# Patient Record
Sex: Male | Born: 1945 | Race: White | Hispanic: No | Marital: Married | Smoking: Never smoker
Health system: Southern US, Community
[De-identification: ages and names within clinical notes are randomized; demographics above are authoritative.]

## PROBLEM LIST (undated history)

## (undated) DIAGNOSIS — F419 Anxiety disorder, unspecified: Secondary | ICD-10-CM

## (undated) DIAGNOSIS — E7800 Pure hypercholesterolemia, unspecified: Secondary | ICD-10-CM

## (undated) DIAGNOSIS — F32A Depression, unspecified: Secondary | ICD-10-CM

## (undated) DIAGNOSIS — I10 Essential (primary) hypertension: Secondary | ICD-10-CM

## (undated) DIAGNOSIS — J45909 Unspecified asthma, uncomplicated: Secondary | ICD-10-CM

## (undated) DIAGNOSIS — J189 Pneumonia, unspecified organism: Secondary | ICD-10-CM

## (undated) DIAGNOSIS — E119 Type 2 diabetes mellitus without complications: Secondary | ICD-10-CM

## (undated) DIAGNOSIS — N289 Disorder of kidney and ureter, unspecified: Secondary | ICD-10-CM

## (undated) HISTORY — PX: NASAL RECONSTRUCTION: SHX2069

## (undated) HISTORY — PX: VASECTOMY: SHX75

## (undated) HISTORY — PX: TONSILLECTOMY: SUR1361

## (undated) HISTORY — PX: VASECTOMY REVERSAL: SHX243

---

## 2015-11-05 DIAGNOSIS — M25511 Pain in right shoulder: Secondary | ICD-10-CM | POA: Diagnosis not present

## 2015-11-19 DIAGNOSIS — K219 Gastro-esophageal reflux disease without esophagitis: Secondary | ICD-10-CM | POA: Diagnosis not present

## 2015-11-19 DIAGNOSIS — Z6827 Body mass index (BMI) 27.0-27.9, adult: Secondary | ICD-10-CM | POA: Diagnosis not present

## 2015-11-19 DIAGNOSIS — L408 Other psoriasis: Secondary | ICD-10-CM | POA: Diagnosis not present

## 2015-11-19 DIAGNOSIS — Z87891 Personal history of nicotine dependence: Secondary | ICD-10-CM | POA: Diagnosis not present

## 2015-11-19 DIAGNOSIS — I10 Essential (primary) hypertension: Secondary | ICD-10-CM | POA: Diagnosis not present

## 2015-12-10 DIAGNOSIS — E7800 Pure hypercholesterolemia, unspecified: Secondary | ICD-10-CM | POA: Diagnosis not present

## 2015-12-10 DIAGNOSIS — I10 Essential (primary) hypertension: Secondary | ICD-10-CM | POA: Diagnosis not present

## 2016-01-21 DIAGNOSIS — I10 Essential (primary) hypertension: Secondary | ICD-10-CM | POA: Diagnosis not present

## 2016-01-21 DIAGNOSIS — E7800 Pure hypercholesterolemia, unspecified: Secondary | ICD-10-CM | POA: Diagnosis not present

## 2016-02-02 DIAGNOSIS — E7800 Pure hypercholesterolemia, unspecified: Secondary | ICD-10-CM | POA: Diagnosis not present

## 2016-02-02 DIAGNOSIS — Z823 Family history of stroke: Secondary | ICD-10-CM | POA: Diagnosis not present

## 2016-02-02 DIAGNOSIS — F329 Major depressive disorder, single episode, unspecified: Secondary | ICD-10-CM | POA: Diagnosis not present

## 2016-02-02 DIAGNOSIS — R410 Disorientation, unspecified: Secondary | ICD-10-CM | POA: Diagnosis not present

## 2016-02-02 DIAGNOSIS — Z79899 Other long term (current) drug therapy: Secondary | ICD-10-CM | POA: Diagnosis not present

## 2016-02-02 DIAGNOSIS — I10 Essential (primary) hypertension: Secondary | ICD-10-CM | POA: Diagnosis not present

## 2016-02-02 DIAGNOSIS — Z7982 Long term (current) use of aspirin: Secondary | ICD-10-CM | POA: Diagnosis not present

## 2016-02-02 DIAGNOSIS — E119 Type 2 diabetes mellitus without complications: Secondary | ICD-10-CM | POA: Diagnosis not present

## 2016-02-02 DIAGNOSIS — R4182 Altered mental status, unspecified: Secondary | ICD-10-CM | POA: Diagnosis not present

## 2016-02-04 DIAGNOSIS — Z299 Encounter for prophylactic measures, unspecified: Secondary | ICD-10-CM | POA: Diagnosis not present

## 2016-02-04 DIAGNOSIS — Z1389 Encounter for screening for other disorder: Secondary | ICD-10-CM | POA: Diagnosis not present

## 2016-02-04 DIAGNOSIS — E782 Mixed hyperlipidemia: Secondary | ICD-10-CM | POA: Diagnosis not present

## 2016-02-04 DIAGNOSIS — G459 Transient cerebral ischemic attack, unspecified: Secondary | ICD-10-CM | POA: Diagnosis not present

## 2016-02-04 DIAGNOSIS — F329 Major depressive disorder, single episode, unspecified: Secondary | ICD-10-CM | POA: Diagnosis not present

## 2016-02-11 DIAGNOSIS — I6523 Occlusion and stenosis of bilateral carotid arteries: Secondary | ICD-10-CM | POA: Diagnosis not present

## 2016-02-11 DIAGNOSIS — G459 Transient cerebral ischemic attack, unspecified: Secondary | ICD-10-CM | POA: Diagnosis not present

## 2016-03-07 DIAGNOSIS — I10 Essential (primary) hypertension: Secondary | ICD-10-CM | POA: Diagnosis not present

## 2016-03-07 DIAGNOSIS — E7800 Pure hypercholesterolemia, unspecified: Secondary | ICD-10-CM | POA: Diagnosis not present

## 2016-04-14 DIAGNOSIS — H2513 Age-related nuclear cataract, bilateral: Secondary | ICD-10-CM | POA: Diagnosis not present

## 2016-05-20 DIAGNOSIS — Z0000 Encounter for general adult medical examination without abnormal findings: Secondary | ICD-10-CM | POA: Diagnosis not present

## 2016-05-20 DIAGNOSIS — Z79899 Other long term (current) drug therapy: Secondary | ICD-10-CM | POA: Diagnosis not present

## 2016-05-20 DIAGNOSIS — Z1211 Encounter for screening for malignant neoplasm of colon: Secondary | ICD-10-CM | POA: Diagnosis not present

## 2016-05-20 DIAGNOSIS — Z299 Encounter for prophylactic measures, unspecified: Secondary | ICD-10-CM | POA: Diagnosis not present

## 2016-05-20 DIAGNOSIS — Z1389 Encounter for screening for other disorder: Secondary | ICD-10-CM | POA: Diagnosis not present

## 2016-05-20 DIAGNOSIS — Z7189 Other specified counseling: Secondary | ICD-10-CM | POA: Diagnosis not present

## 2016-05-20 DIAGNOSIS — R5383 Other fatigue: Secondary | ICD-10-CM | POA: Diagnosis not present

## 2016-05-20 DIAGNOSIS — E782 Mixed hyperlipidemia: Secondary | ICD-10-CM | POA: Diagnosis not present

## 2016-05-20 DIAGNOSIS — Z125 Encounter for screening for malignant neoplasm of prostate: Secondary | ICD-10-CM | POA: Diagnosis not present

## 2016-06-09 DIAGNOSIS — Z23 Encounter for immunization: Secondary | ICD-10-CM | POA: Diagnosis not present

## 2016-06-10 DIAGNOSIS — E1122 Type 2 diabetes mellitus with diabetic chronic kidney disease: Secondary | ICD-10-CM | POA: Diagnosis not present

## 2016-06-10 DIAGNOSIS — N182 Chronic kidney disease, stage 2 (mild): Secondary | ICD-10-CM | POA: Diagnosis not present

## 2016-06-10 DIAGNOSIS — I10 Essential (primary) hypertension: Secondary | ICD-10-CM | POA: Diagnosis not present

## 2016-06-10 DIAGNOSIS — F329 Major depressive disorder, single episode, unspecified: Secondary | ICD-10-CM | POA: Diagnosis not present

## 2016-06-10 DIAGNOSIS — Z6827 Body mass index (BMI) 27.0-27.9, adult: Secondary | ICD-10-CM | POA: Diagnosis not present

## 2016-06-24 DIAGNOSIS — E119 Type 2 diabetes mellitus without complications: Secondary | ICD-10-CM | POA: Diagnosis not present

## 2016-07-18 DIAGNOSIS — I129 Hypertensive chronic kidney disease with stage 1 through stage 4 chronic kidney disease, or unspecified chronic kidney disease: Secondary | ICD-10-CM | POA: Diagnosis not present

## 2016-07-18 DIAGNOSIS — L049 Acute lymphadenitis, unspecified: Secondary | ICD-10-CM | POA: Diagnosis not present

## 2016-07-18 DIAGNOSIS — E1129 Type 2 diabetes mellitus with other diabetic kidney complication: Secondary | ICD-10-CM | POA: Diagnosis not present

## 2016-07-18 DIAGNOSIS — N182 Chronic kidney disease, stage 2 (mild): Secondary | ICD-10-CM | POA: Diagnosis not present

## 2016-07-18 DIAGNOSIS — E7800 Pure hypercholesterolemia, unspecified: Secondary | ICD-10-CM | POA: Diagnosis not present

## 2016-07-21 DIAGNOSIS — H25012 Cortical age-related cataract, left eye: Secondary | ICD-10-CM | POA: Diagnosis not present

## 2016-07-21 DIAGNOSIS — H2512 Age-related nuclear cataract, left eye: Secondary | ICD-10-CM | POA: Diagnosis not present

## 2016-07-21 DIAGNOSIS — H25013 Cortical age-related cataract, bilateral: Secondary | ICD-10-CM | POA: Diagnosis not present

## 2016-07-21 DIAGNOSIS — H35033 Hypertensive retinopathy, bilateral: Secondary | ICD-10-CM | POA: Diagnosis not present

## 2016-07-21 DIAGNOSIS — H2513 Age-related nuclear cataract, bilateral: Secondary | ICD-10-CM | POA: Diagnosis not present

## 2016-07-29 DIAGNOSIS — H2511 Age-related nuclear cataract, right eye: Secondary | ICD-10-CM | POA: Diagnosis not present

## 2016-08-19 DIAGNOSIS — H2512 Age-related nuclear cataract, left eye: Secondary | ICD-10-CM | POA: Diagnosis not present

## 2016-08-19 DIAGNOSIS — H25012 Cortical age-related cataract, left eye: Secondary | ICD-10-CM | POA: Diagnosis not present

## 2016-08-26 DIAGNOSIS — H25012 Cortical age-related cataract, left eye: Secondary | ICD-10-CM | POA: Diagnosis not present

## 2016-08-26 DIAGNOSIS — H2512 Age-related nuclear cataract, left eye: Secondary | ICD-10-CM | POA: Diagnosis not present

## 2016-08-26 DIAGNOSIS — H25812 Combined forms of age-related cataract, left eye: Secondary | ICD-10-CM | POA: Diagnosis not present

## 2016-09-15 DIAGNOSIS — Z713 Dietary counseling and surveillance: Secondary | ICD-10-CM | POA: Diagnosis not present

## 2016-09-15 DIAGNOSIS — Z6826 Body mass index (BMI) 26.0-26.9, adult: Secondary | ICD-10-CM | POA: Diagnosis not present

## 2016-09-15 DIAGNOSIS — N182 Chronic kidney disease, stage 2 (mild): Secondary | ICD-10-CM | POA: Diagnosis not present

## 2016-09-15 DIAGNOSIS — Z299 Encounter for prophylactic measures, unspecified: Secondary | ICD-10-CM | POA: Diagnosis not present

## 2016-09-15 DIAGNOSIS — E1122 Type 2 diabetes mellitus with diabetic chronic kidney disease: Secondary | ICD-10-CM | POA: Diagnosis not present

## 2016-11-21 DIAGNOSIS — E1122 Type 2 diabetes mellitus with diabetic chronic kidney disease: Secondary | ICD-10-CM | POA: Diagnosis not present

## 2016-11-21 DIAGNOSIS — Z6826 Body mass index (BMI) 26.0-26.9, adult: Secondary | ICD-10-CM | POA: Diagnosis not present

## 2016-11-21 DIAGNOSIS — Z299 Encounter for prophylactic measures, unspecified: Secondary | ICD-10-CM | POA: Diagnosis not present

## 2016-11-21 DIAGNOSIS — Z87891 Personal history of nicotine dependence: Secondary | ICD-10-CM | POA: Diagnosis not present

## 2016-11-21 DIAGNOSIS — Z79899 Other long term (current) drug therapy: Secondary | ICD-10-CM | POA: Diagnosis not present

## 2016-11-21 DIAGNOSIS — N182 Chronic kidney disease, stage 2 (mild): Secondary | ICD-10-CM | POA: Diagnosis not present

## 2016-11-21 DIAGNOSIS — E7800 Pure hypercholesterolemia, unspecified: Secondary | ICD-10-CM | POA: Diagnosis not present

## 2016-11-21 DIAGNOSIS — F329 Major depressive disorder, single episode, unspecified: Secondary | ICD-10-CM | POA: Diagnosis not present

## 2016-11-21 DIAGNOSIS — I10 Essential (primary) hypertension: Secondary | ICD-10-CM | POA: Diagnosis not present

## 2016-11-21 DIAGNOSIS — Z713 Dietary counseling and surveillance: Secondary | ICD-10-CM | POA: Diagnosis not present

## 2016-12-19 DIAGNOSIS — F329 Major depressive disorder, single episode, unspecified: Secondary | ICD-10-CM | POA: Diagnosis not present

## 2016-12-19 DIAGNOSIS — L409 Psoriasis, unspecified: Secondary | ICD-10-CM | POA: Diagnosis not present

## 2016-12-19 DIAGNOSIS — E1122 Type 2 diabetes mellitus with diabetic chronic kidney disease: Secondary | ICD-10-CM | POA: Diagnosis not present

## 2016-12-19 DIAGNOSIS — I10 Essential (primary) hypertension: Secondary | ICD-10-CM | POA: Diagnosis not present

## 2016-12-19 DIAGNOSIS — Z713 Dietary counseling and surveillance: Secondary | ICD-10-CM | POA: Diagnosis not present

## 2016-12-19 DIAGNOSIS — Z6825 Body mass index (BMI) 25.0-25.9, adult: Secondary | ICD-10-CM | POA: Diagnosis not present

## 2016-12-19 DIAGNOSIS — E7800 Pure hypercholesterolemia, unspecified: Secondary | ICD-10-CM | POA: Diagnosis not present

## 2016-12-19 DIAGNOSIS — G459 Transient cerebral ischemic attack, unspecified: Secondary | ICD-10-CM | POA: Diagnosis not present

## 2016-12-19 DIAGNOSIS — N182 Chronic kidney disease, stage 2 (mild): Secondary | ICD-10-CM | POA: Diagnosis not present

## 2016-12-19 DIAGNOSIS — Z299 Encounter for prophylactic measures, unspecified: Secondary | ICD-10-CM | POA: Diagnosis not present

## 2016-12-19 DIAGNOSIS — I6529 Occlusion and stenosis of unspecified carotid artery: Secondary | ICD-10-CM | POA: Diagnosis not present

## 2016-12-19 DIAGNOSIS — K219 Gastro-esophageal reflux disease without esophagitis: Secondary | ICD-10-CM | POA: Diagnosis not present

## 2017-02-26 DIAGNOSIS — I129 Hypertensive chronic kidney disease with stage 1 through stage 4 chronic kidney disease, or unspecified chronic kidney disease: Secondary | ICD-10-CM | POA: Diagnosis not present

## 2017-02-26 DIAGNOSIS — E7800 Pure hypercholesterolemia, unspecified: Secondary | ICD-10-CM | POA: Diagnosis not present

## 2017-02-26 DIAGNOSIS — E1129 Type 2 diabetes mellitus with other diabetic kidney complication: Secondary | ICD-10-CM | POA: Diagnosis not present

## 2017-02-26 DIAGNOSIS — N182 Chronic kidney disease, stage 2 (mild): Secondary | ICD-10-CM | POA: Diagnosis not present

## 2017-03-03 DIAGNOSIS — Z961 Presence of intraocular lens: Secondary | ICD-10-CM | POA: Diagnosis not present

## 2017-03-26 DIAGNOSIS — Z6826 Body mass index (BMI) 26.0-26.9, adult: Secondary | ICD-10-CM | POA: Diagnosis not present

## 2017-03-26 DIAGNOSIS — L409 Psoriasis, unspecified: Secondary | ICD-10-CM | POA: Diagnosis not present

## 2017-03-26 DIAGNOSIS — Z299 Encounter for prophylactic measures, unspecified: Secondary | ICD-10-CM | POA: Diagnosis not present

## 2017-03-26 DIAGNOSIS — E119 Type 2 diabetes mellitus without complications: Secondary | ICD-10-CM | POA: Diagnosis not present

## 2017-03-26 DIAGNOSIS — F329 Major depressive disorder, single episode, unspecified: Secondary | ICD-10-CM | POA: Diagnosis not present

## 2017-03-26 DIAGNOSIS — Z713 Dietary counseling and surveillance: Secondary | ICD-10-CM | POA: Diagnosis not present

## 2017-03-26 DIAGNOSIS — I10 Essential (primary) hypertension: Secondary | ICD-10-CM | POA: Diagnosis not present

## 2017-03-26 DIAGNOSIS — E7800 Pure hypercholesterolemia, unspecified: Secondary | ICD-10-CM | POA: Diagnosis not present

## 2017-06-08 DIAGNOSIS — Z23 Encounter for immunization: Secondary | ICD-10-CM | POA: Diagnosis not present

## 2017-07-02 DIAGNOSIS — Z6826 Body mass index (BMI) 26.0-26.9, adult: Secondary | ICD-10-CM | POA: Diagnosis not present

## 2017-07-02 DIAGNOSIS — E7800 Pure hypercholesterolemia, unspecified: Secondary | ICD-10-CM | POA: Diagnosis not present

## 2017-07-02 DIAGNOSIS — L409 Psoriasis, unspecified: Secondary | ICD-10-CM | POA: Diagnosis not present

## 2017-07-02 DIAGNOSIS — I10 Essential (primary) hypertension: Secondary | ICD-10-CM | POA: Diagnosis not present

## 2017-07-02 DIAGNOSIS — Z713 Dietary counseling and surveillance: Secondary | ICD-10-CM | POA: Diagnosis not present

## 2017-07-02 DIAGNOSIS — I6529 Occlusion and stenosis of unspecified carotid artery: Secondary | ICD-10-CM | POA: Diagnosis not present

## 2017-07-02 DIAGNOSIS — Z299 Encounter for prophylactic measures, unspecified: Secondary | ICD-10-CM | POA: Diagnosis not present

## 2017-07-02 DIAGNOSIS — E1165 Type 2 diabetes mellitus with hyperglycemia: Secondary | ICD-10-CM | POA: Diagnosis not present

## 2017-10-08 DIAGNOSIS — Z87891 Personal history of nicotine dependence: Secondary | ICD-10-CM | POA: Diagnosis not present

## 2017-10-08 DIAGNOSIS — Z299 Encounter for prophylactic measures, unspecified: Secondary | ICD-10-CM | POA: Diagnosis not present

## 2017-10-08 DIAGNOSIS — Z6827 Body mass index (BMI) 27.0-27.9, adult: Secondary | ICD-10-CM | POA: Diagnosis not present

## 2017-10-08 DIAGNOSIS — E1165 Type 2 diabetes mellitus with hyperglycemia: Secondary | ICD-10-CM | POA: Diagnosis not present

## 2017-10-08 DIAGNOSIS — I10 Essential (primary) hypertension: Secondary | ICD-10-CM | POA: Diagnosis not present

## 2017-10-13 DIAGNOSIS — Z862 Personal history of diseases of the blood and blood-forming organs and certain disorders involving the immune mechanism: Secondary | ICD-10-CM | POA: Diagnosis not present

## 2017-10-13 DIAGNOSIS — F329 Major depressive disorder, single episode, unspecified: Secondary | ICD-10-CM | POA: Diagnosis not present

## 2017-10-13 DIAGNOSIS — E039 Hypothyroidism, unspecified: Secondary | ICD-10-CM | POA: Diagnosis not present

## 2017-10-13 DIAGNOSIS — N2581 Secondary hyperparathyroidism of renal origin: Secondary | ICD-10-CM | POA: Diagnosis not present

## 2017-10-13 DIAGNOSIS — N182 Chronic kidney disease, stage 2 (mild): Secondary | ICD-10-CM | POA: Diagnosis not present

## 2017-10-13 DIAGNOSIS — I129 Hypertensive chronic kidney disease with stage 1 through stage 4 chronic kidney disease, or unspecified chronic kidney disease: Secondary | ICD-10-CM | POA: Diagnosis not present

## 2017-10-13 DIAGNOSIS — E1122 Type 2 diabetes mellitus with diabetic chronic kidney disease: Secondary | ICD-10-CM | POA: Diagnosis not present

## 2017-10-13 DIAGNOSIS — E7800 Pure hypercholesterolemia, unspecified: Secondary | ICD-10-CM | POA: Diagnosis not present

## 2017-12-17 DIAGNOSIS — Z1331 Encounter for screening for depression: Secondary | ICD-10-CM | POA: Diagnosis not present

## 2017-12-17 DIAGNOSIS — Z79899 Other long term (current) drug therapy: Secondary | ICD-10-CM | POA: Diagnosis not present

## 2017-12-17 DIAGNOSIS — E7800 Pure hypercholesterolemia, unspecified: Secondary | ICD-10-CM | POA: Diagnosis not present

## 2017-12-17 DIAGNOSIS — Z6827 Body mass index (BMI) 27.0-27.9, adult: Secondary | ICD-10-CM | POA: Diagnosis not present

## 2017-12-17 DIAGNOSIS — Z299 Encounter for prophylactic measures, unspecified: Secondary | ICD-10-CM | POA: Diagnosis not present

## 2017-12-17 DIAGNOSIS — R5383 Other fatigue: Secondary | ICD-10-CM | POA: Diagnosis not present

## 2017-12-17 DIAGNOSIS — I10 Essential (primary) hypertension: Secondary | ICD-10-CM | POA: Diagnosis not present

## 2017-12-17 DIAGNOSIS — E1165 Type 2 diabetes mellitus with hyperglycemia: Secondary | ICD-10-CM | POA: Diagnosis not present

## 2017-12-17 DIAGNOSIS — Z1211 Encounter for screening for malignant neoplasm of colon: Secondary | ICD-10-CM | POA: Diagnosis not present

## 2017-12-17 DIAGNOSIS — Z7189 Other specified counseling: Secondary | ICD-10-CM | POA: Diagnosis not present

## 2017-12-17 DIAGNOSIS — Z1339 Encounter for screening examination for other mental health and behavioral disorders: Secondary | ICD-10-CM | POA: Diagnosis not present

## 2017-12-17 DIAGNOSIS — Z0000 Encounter for general adult medical examination without abnormal findings: Secondary | ICD-10-CM | POA: Diagnosis not present

## 2017-12-18 DIAGNOSIS — Z125 Encounter for screening for malignant neoplasm of prostate: Secondary | ICD-10-CM | POA: Diagnosis not present

## 2017-12-18 DIAGNOSIS — Z79899 Other long term (current) drug therapy: Secondary | ICD-10-CM | POA: Diagnosis not present

## 2017-12-18 DIAGNOSIS — E7800 Pure hypercholesterolemia, unspecified: Secondary | ICD-10-CM | POA: Diagnosis not present

## 2017-12-18 DIAGNOSIS — R5383 Other fatigue: Secondary | ICD-10-CM | POA: Diagnosis not present

## 2018-03-29 DIAGNOSIS — E1165 Type 2 diabetes mellitus with hyperglycemia: Secondary | ICD-10-CM | POA: Diagnosis not present

## 2018-03-29 DIAGNOSIS — Z299 Encounter for prophylactic measures, unspecified: Secondary | ICD-10-CM | POA: Diagnosis not present

## 2018-03-29 DIAGNOSIS — I10 Essential (primary) hypertension: Secondary | ICD-10-CM | POA: Diagnosis not present

## 2018-03-29 DIAGNOSIS — Z6826 Body mass index (BMI) 26.0-26.9, adult: Secondary | ICD-10-CM | POA: Diagnosis not present

## 2018-03-29 DIAGNOSIS — Z713 Dietary counseling and surveillance: Secondary | ICD-10-CM | POA: Diagnosis not present

## 2018-06-10 DIAGNOSIS — N182 Chronic kidney disease, stage 2 (mild): Secondary | ICD-10-CM | POA: Diagnosis not present

## 2018-06-10 DIAGNOSIS — E7800 Pure hypercholesterolemia, unspecified: Secondary | ICD-10-CM | POA: Diagnosis not present

## 2018-06-10 DIAGNOSIS — E1122 Type 2 diabetes mellitus with diabetic chronic kidney disease: Secondary | ICD-10-CM | POA: Diagnosis not present

## 2018-06-10 DIAGNOSIS — E1129 Type 2 diabetes mellitus with other diabetic kidney complication: Secondary | ICD-10-CM | POA: Diagnosis not present

## 2018-06-10 DIAGNOSIS — L409 Psoriasis, unspecified: Secondary | ICD-10-CM | POA: Diagnosis not present

## 2018-06-10 DIAGNOSIS — I129 Hypertensive chronic kidney disease with stage 1 through stage 4 chronic kidney disease, or unspecified chronic kidney disease: Secondary | ICD-10-CM | POA: Diagnosis not present

## 2018-06-10 DIAGNOSIS — Z23 Encounter for immunization: Secondary | ICD-10-CM | POA: Diagnosis not present

## 2018-07-05 DIAGNOSIS — I10 Essential (primary) hypertension: Secondary | ICD-10-CM | POA: Diagnosis not present

## 2018-07-05 DIAGNOSIS — Z713 Dietary counseling and surveillance: Secondary | ICD-10-CM | POA: Diagnosis not present

## 2018-07-05 DIAGNOSIS — Z299 Encounter for prophylactic measures, unspecified: Secondary | ICD-10-CM | POA: Diagnosis not present

## 2018-07-05 DIAGNOSIS — Z6826 Body mass index (BMI) 26.0-26.9, adult: Secondary | ICD-10-CM | POA: Diagnosis not present

## 2018-07-05 DIAGNOSIS — E1165 Type 2 diabetes mellitus with hyperglycemia: Secondary | ICD-10-CM | POA: Diagnosis not present

## 2018-08-23 ENCOUNTER — Ambulatory Visit: Payer: Self-pay | Admitting: Nutrition

## 2018-09-07 DIAGNOSIS — H43812 Vitreous degeneration, left eye: Secondary | ICD-10-CM | POA: Diagnosis not present

## 2018-10-11 ENCOUNTER — Ambulatory Visit: Payer: Self-pay | Admitting: Nutrition

## 2018-10-18 DIAGNOSIS — I10 Essential (primary) hypertension: Secondary | ICD-10-CM | POA: Diagnosis not present

## 2018-10-18 DIAGNOSIS — Z299 Encounter for prophylactic measures, unspecified: Secondary | ICD-10-CM | POA: Diagnosis not present

## 2018-10-18 DIAGNOSIS — Z6826 Body mass index (BMI) 26.0-26.9, adult: Secondary | ICD-10-CM | POA: Diagnosis not present

## 2018-10-18 DIAGNOSIS — E1165 Type 2 diabetes mellitus with hyperglycemia: Secondary | ICD-10-CM | POA: Diagnosis not present

## 2018-10-18 DIAGNOSIS — Z87891 Personal history of nicotine dependence: Secondary | ICD-10-CM | POA: Diagnosis not present

## 2018-11-03 ENCOUNTER — Encounter: Payer: Medicare Other | Attending: Nephrology | Admitting: Nutrition

## 2018-11-03 VITALS — Ht 72.0 in | Wt 194.0 lb

## 2018-11-03 DIAGNOSIS — I10 Essential (primary) hypertension: Secondary | ICD-10-CM | POA: Insufficient documentation

## 2018-11-03 DIAGNOSIS — E1165 Type 2 diabetes mellitus with hyperglycemia: Secondary | ICD-10-CM | POA: Insufficient documentation

## 2018-11-03 DIAGNOSIS — IMO0002 Reserved for concepts with insufficient information to code with codable children: Secondary | ICD-10-CM

## 2018-11-03 DIAGNOSIS — E118 Type 2 diabetes mellitus with unspecified complications: Secondary | ICD-10-CM | POA: Diagnosis not present

## 2018-11-03 DIAGNOSIS — N182 Chronic kidney disease, stage 2 (mild): Secondary | ICD-10-CM | POA: Insufficient documentation

## 2018-11-09 NOTE — Progress Notes (Addendum)
  Medical Nutrition Therapy:  Appt start time: 5462 end time:  1630.  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 PMH: Hyperlipidemia, HTN. Sees Dr. Marval Regal for CKD.  Here with his wife. He is worried about his kidney function. Wants a low salt diet. Recently DX with Type 2 DM. A1C 6.6%. Eats 2-3 meals per day. LIkes to snack late at night. Enjoyed a lot of cheese and salty foods at times. Walks some and is usually active.  He has been trying to take a lot of supplements, 19 to be exact,  to make sure he is getting all the nutrients for his health intsead of getting them from better quality of foods. High risk for possible drug nutrient interactions with prescribed medications and toxicity to the liver and may effect his kidneys as well.Marland Kitchen        He is willing to change his eating habits and stop taking all OTC supplements except his MVI.        His wife is healthy conscious and works on eating more organic foods and a plant based diet at times.  Both are engaged to make some changes with diet and exercise to improve health and reduce sodium and modify carb intake.          Not on any medications for DM at this time.   BMI 27.  Preferred Learning Style:   No preference indicated   Learning Readiness:  Ready  Change in progress   MEDICATIONS: see list   DIETARY INTAKE:  Eats 2-3 meals per day, snacks at night. Drinks Diet sodas.   Usual physical activity: ADL  Estimated energy needs: 1600-1800  calories 180 g carbohydrates 120 g protein 44 g fat  Progress Towards Goal(s):  In progress.   Nutritional Diagnosis:  NB-1.1 Food and nutrition-related knowledge deficit As related to Type 2 DM and Stg 2 CKD.  As evidenced by A1C 6.6% and elevated BS 129/82 at last MD visit..    Intervention:  Nutrition and Diabetes education provided on My Plate, CHO counting, meal planning, portion sizes, timing of meals, avoiding snacks between meals unless having a low blood  sugar, target ranges for A1C and blood sugars, signs/symptoms and treatment of hyper/hypoglycemia, monitoring blood sugars, taking medications as prescribed, benefits of exercising 30 minutes per day and prevention of complications of DM.'. Low Salt Diet.    Goals 1. Stop OTC medications except the multivitamin. 2. Drink only water 3. Follow MY Plate 4. Don't eat past 7 pm Walk 30-60 minutes a day  Follow Low Salt diet.    Teaching Method Utilized:  Visual Auditory Hands on  Handouts given during visit include:  The Plate Method   Meal Plan Card  Low Sodium Diet/Low Salt seasonings  Barriers to learning/adherence to lifestyle change: none  Demonstrated degree of understanding via:  Teach Back   Monitoring/Evaluation:  Dietary intake, exercise,  and body weight in PRN

## 2018-11-10 ENCOUNTER — Encounter: Payer: Self-pay | Admitting: Nutrition

## 2018-11-10 NOTE — Patient Instructions (Signed)
Goals 1. Stop OTC medications except the multivitamin. 2. Drink only water 3. Follow MY Plate 4. Don't eat past 7 pm Walk 30-60 minutes a day  Follow Low Salt diet.

## 2018-12-06 DIAGNOSIS — E7800 Pure hypercholesterolemia, unspecified: Secondary | ICD-10-CM | POA: Diagnosis not present

## 2018-12-06 DIAGNOSIS — E1122 Type 2 diabetes mellitus with diabetic chronic kidney disease: Secondary | ICD-10-CM | POA: Diagnosis not present

## 2018-12-06 DIAGNOSIS — N189 Chronic kidney disease, unspecified: Secondary | ICD-10-CM | POA: Diagnosis not present

## 2018-12-06 DIAGNOSIS — I129 Hypertensive chronic kidney disease with stage 1 through stage 4 chronic kidney disease, or unspecified chronic kidney disease: Secondary | ICD-10-CM | POA: Diagnosis not present

## 2018-12-06 DIAGNOSIS — K219 Gastro-esophageal reflux disease without esophagitis: Secondary | ICD-10-CM | POA: Diagnosis not present

## 2018-12-06 DIAGNOSIS — Z862 Personal history of diseases of the blood and blood-forming organs and certain disorders involving the immune mechanism: Secondary | ICD-10-CM | POA: Diagnosis not present

## 2018-12-06 DIAGNOSIS — N2581 Secondary hyperparathyroidism of renal origin: Secondary | ICD-10-CM | POA: Diagnosis not present

## 2018-12-06 DIAGNOSIS — N182 Chronic kidney disease, stage 2 (mild): Secondary | ICD-10-CM | POA: Diagnosis not present

## 2019-03-23 DIAGNOSIS — Z0000 Encounter for general adult medical examination without abnormal findings: Secondary | ICD-10-CM | POA: Diagnosis not present

## 2019-03-23 DIAGNOSIS — Z6826 Body mass index (BMI) 26.0-26.9, adult: Secondary | ICD-10-CM | POA: Diagnosis not present

## 2019-03-23 DIAGNOSIS — Z1331 Encounter for screening for depression: Secondary | ICD-10-CM | POA: Diagnosis not present

## 2019-03-23 DIAGNOSIS — Z1339 Encounter for screening examination for other mental health and behavioral disorders: Secondary | ICD-10-CM | POA: Diagnosis not present

## 2019-03-23 DIAGNOSIS — E7800 Pure hypercholesterolemia, unspecified: Secondary | ICD-10-CM | POA: Diagnosis not present

## 2019-03-23 DIAGNOSIS — Z7189 Other specified counseling: Secondary | ICD-10-CM | POA: Diagnosis not present

## 2019-03-23 DIAGNOSIS — Z299 Encounter for prophylactic measures, unspecified: Secondary | ICD-10-CM | POA: Diagnosis not present

## 2019-03-23 DIAGNOSIS — E1165 Type 2 diabetes mellitus with hyperglycemia: Secondary | ICD-10-CM | POA: Diagnosis not present

## 2019-03-23 DIAGNOSIS — Z1211 Encounter for screening for malignant neoplasm of colon: Secondary | ICD-10-CM | POA: Diagnosis not present

## 2019-03-23 DIAGNOSIS — R5383 Other fatigue: Secondary | ICD-10-CM | POA: Diagnosis not present

## 2019-04-05 DIAGNOSIS — E1159 Type 2 diabetes mellitus with other circulatory complications: Secondary | ICD-10-CM | POA: Diagnosis not present

## 2019-04-05 DIAGNOSIS — R5383 Other fatigue: Secondary | ICD-10-CM | POA: Diagnosis not present

## 2019-04-05 DIAGNOSIS — E7800 Pure hypercholesterolemia, unspecified: Secondary | ICD-10-CM | POA: Diagnosis not present

## 2019-04-05 DIAGNOSIS — Z79899 Other long term (current) drug therapy: Secondary | ICD-10-CM | POA: Diagnosis not present

## 2019-04-05 DIAGNOSIS — Z125 Encounter for screening for malignant neoplasm of prostate: Secondary | ICD-10-CM | POA: Diagnosis not present

## 2019-04-05 DIAGNOSIS — E1140 Type 2 diabetes mellitus with diabetic neuropathy, unspecified: Secondary | ICD-10-CM | POA: Diagnosis not present

## 2019-04-18 DIAGNOSIS — I10 Essential (primary) hypertension: Secondary | ICD-10-CM | POA: Diagnosis not present

## 2019-04-18 DIAGNOSIS — Z6826 Body mass index (BMI) 26.0-26.9, adult: Secondary | ICD-10-CM | POA: Diagnosis not present

## 2019-04-18 DIAGNOSIS — Z299 Encounter for prophylactic measures, unspecified: Secondary | ICD-10-CM | POA: Diagnosis not present

## 2019-04-18 DIAGNOSIS — E1165 Type 2 diabetes mellitus with hyperglycemia: Secondary | ICD-10-CM | POA: Diagnosis not present

## 2019-04-18 DIAGNOSIS — Z713 Dietary counseling and surveillance: Secondary | ICD-10-CM | POA: Diagnosis not present

## 2019-06-07 DIAGNOSIS — Z23 Encounter for immunization: Secondary | ICD-10-CM | POA: Diagnosis not present

## 2019-06-29 DIAGNOSIS — N189 Chronic kidney disease, unspecified: Secondary | ICD-10-CM | POA: Diagnosis not present

## 2019-06-29 DIAGNOSIS — E7800 Pure hypercholesterolemia, unspecified: Secondary | ICD-10-CM | POA: Diagnosis not present

## 2019-06-29 DIAGNOSIS — N2581 Secondary hyperparathyroidism of renal origin: Secondary | ICD-10-CM | POA: Diagnosis not present

## 2019-06-29 DIAGNOSIS — E1122 Type 2 diabetes mellitus with diabetic chronic kidney disease: Secondary | ICD-10-CM | POA: Diagnosis not present

## 2019-06-29 DIAGNOSIS — I129 Hypertensive chronic kidney disease with stage 1 through stage 4 chronic kidney disease, or unspecified chronic kidney disease: Secondary | ICD-10-CM | POA: Diagnosis not present

## 2019-06-29 DIAGNOSIS — N182 Chronic kidney disease, stage 2 (mild): Secondary | ICD-10-CM | POA: Diagnosis not present

## 2019-06-29 DIAGNOSIS — Z862 Personal history of diseases of the blood and blood-forming organs and certain disorders involving the immune mechanism: Secondary | ICD-10-CM | POA: Diagnosis not present

## 2019-06-29 DIAGNOSIS — K219 Gastro-esophageal reflux disease without esophagitis: Secondary | ICD-10-CM | POA: Diagnosis not present

## 2019-07-01 DIAGNOSIS — E1165 Type 2 diabetes mellitus with hyperglycemia: Secondary | ICD-10-CM | POA: Diagnosis not present

## 2019-07-01 DIAGNOSIS — Z713 Dietary counseling and surveillance: Secondary | ICD-10-CM | POA: Diagnosis not present

## 2019-07-01 DIAGNOSIS — Z6824 Body mass index (BMI) 24.0-24.9, adult: Secondary | ICD-10-CM | POA: Diagnosis not present

## 2019-07-01 DIAGNOSIS — Z299 Encounter for prophylactic measures, unspecified: Secondary | ICD-10-CM | POA: Diagnosis not present

## 2019-07-01 DIAGNOSIS — I10 Essential (primary) hypertension: Secondary | ICD-10-CM | POA: Diagnosis not present

## 2019-10-20 DIAGNOSIS — Z23 Encounter for immunization: Secondary | ICD-10-CM | POA: Diagnosis not present

## 2019-11-21 DIAGNOSIS — Z23 Encounter for immunization: Secondary | ICD-10-CM | POA: Diagnosis not present

## 2019-12-13 DIAGNOSIS — N182 Chronic kidney disease, stage 2 (mild): Secondary | ICD-10-CM | POA: Diagnosis not present

## 2019-12-13 DIAGNOSIS — F329 Major depressive disorder, single episode, unspecified: Secondary | ICD-10-CM | POA: Diagnosis not present

## 2019-12-13 DIAGNOSIS — E1129 Type 2 diabetes mellitus with other diabetic kidney complication: Secondary | ICD-10-CM | POA: Diagnosis not present

## 2019-12-13 DIAGNOSIS — E7800 Pure hypercholesterolemia, unspecified: Secondary | ICD-10-CM | POA: Diagnosis not present

## 2019-12-13 DIAGNOSIS — Z862 Personal history of diseases of the blood and blood-forming organs and certain disorders involving the immune mechanism: Secondary | ICD-10-CM | POA: Diagnosis not present

## 2019-12-13 DIAGNOSIS — E1122 Type 2 diabetes mellitus with diabetic chronic kidney disease: Secondary | ICD-10-CM | POA: Diagnosis not present

## 2019-12-13 DIAGNOSIS — N2581 Secondary hyperparathyroidism of renal origin: Secondary | ICD-10-CM | POA: Diagnosis not present

## 2019-12-13 DIAGNOSIS — I129 Hypertensive chronic kidney disease with stage 1 through stage 4 chronic kidney disease, or unspecified chronic kidney disease: Secondary | ICD-10-CM | POA: Diagnosis not present

## 2020-03-01 DIAGNOSIS — H43813 Vitreous degeneration, bilateral: Secondary | ICD-10-CM | POA: Diagnosis not present

## 2020-06-05 DIAGNOSIS — Z23 Encounter for immunization: Secondary | ICD-10-CM | POA: Diagnosis not present

## 2020-06-06 DIAGNOSIS — Z23 Encounter for immunization: Secondary | ICD-10-CM | POA: Diagnosis not present

## 2020-06-29 DIAGNOSIS — I10 Essential (primary) hypertension: Secondary | ICD-10-CM | POA: Diagnosis not present

## 2020-06-29 DIAGNOSIS — Z299 Encounter for prophylactic measures, unspecified: Secondary | ICD-10-CM | POA: Diagnosis not present

## 2020-06-29 DIAGNOSIS — Z6824 Body mass index (BMI) 24.0-24.9, adult: Secondary | ICD-10-CM | POA: Diagnosis not present

## 2020-06-29 DIAGNOSIS — E1165 Type 2 diabetes mellitus with hyperglycemia: Secondary | ICD-10-CM | POA: Diagnosis not present

## 2020-07-19 DIAGNOSIS — Z1211 Encounter for screening for malignant neoplasm of colon: Secondary | ICD-10-CM | POA: Diagnosis not present

## 2020-07-19 DIAGNOSIS — Z1212 Encounter for screening for malignant neoplasm of rectum: Secondary | ICD-10-CM | POA: Diagnosis not present

## 2020-07-24 DIAGNOSIS — R509 Fever, unspecified: Secondary | ICD-10-CM | POA: Diagnosis not present

## 2020-07-24 DIAGNOSIS — R5383 Other fatigue: Secondary | ICD-10-CM | POA: Diagnosis not present

## 2020-07-24 DIAGNOSIS — Z20822 Contact with and (suspected) exposure to covid-19: Secondary | ICD-10-CM | POA: Diagnosis not present

## 2020-07-24 DIAGNOSIS — Z299 Encounter for prophylactic measures, unspecified: Secondary | ICD-10-CM | POA: Diagnosis not present

## 2020-07-24 DIAGNOSIS — R350 Frequency of micturition: Secondary | ICD-10-CM | POA: Diagnosis not present

## 2020-07-24 DIAGNOSIS — E1165 Type 2 diabetes mellitus with hyperglycemia: Secondary | ICD-10-CM | POA: Diagnosis not present

## 2020-07-25 DIAGNOSIS — N189 Chronic kidney disease, unspecified: Secondary | ICD-10-CM | POA: Diagnosis not present

## 2020-07-25 DIAGNOSIS — I129 Hypertensive chronic kidney disease with stage 1 through stage 4 chronic kidney disease, or unspecified chronic kidney disease: Secondary | ICD-10-CM | POA: Diagnosis not present

## 2020-07-25 DIAGNOSIS — J189 Pneumonia, unspecified organism: Secondary | ICD-10-CM | POA: Diagnosis not present

## 2020-07-25 DIAGNOSIS — R509 Fever, unspecified: Secondary | ICD-10-CM | POA: Diagnosis not present

## 2020-07-25 DIAGNOSIS — Z20822 Contact with and (suspected) exposure to covid-19: Secondary | ICD-10-CM | POA: Diagnosis not present

## 2020-07-25 DIAGNOSIS — R918 Other nonspecific abnormal finding of lung field: Secondary | ICD-10-CM | POA: Diagnosis not present

## 2020-07-25 DIAGNOSIS — E876 Hypokalemia: Secondary | ICD-10-CM | POA: Diagnosis not present

## 2020-07-25 DIAGNOSIS — R350 Frequency of micturition: Secondary | ICD-10-CM | POA: Diagnosis not present

## 2020-07-28 ENCOUNTER — Emergency Department (HOSPITAL_COMMUNITY): Payer: Medicare Other

## 2020-07-28 ENCOUNTER — Inpatient Hospital Stay (HOSPITAL_COMMUNITY)
Admission: EM | Admit: 2020-07-28 | Discharge: 2020-08-02 | DRG: 866 | Disposition: A | Discharge status: Home / Self Care | Payer: Medicare Other | Attending: Family Medicine | Admitting: Family Medicine

## 2020-07-28 ENCOUNTER — Encounter (HOSPITAL_COMMUNITY): Payer: Self-pay | Admitting: Emergency Medicine

## 2020-07-28 ENCOUNTER — Other Ambulatory Visit: Payer: Self-pay

## 2020-07-28 DIAGNOSIS — F418 Other specified anxiety disorders: Secondary | ICD-10-CM | POA: Diagnosis present

## 2020-07-28 DIAGNOSIS — E1122 Type 2 diabetes mellitus with diabetic chronic kidney disease: Secondary | ICD-10-CM

## 2020-07-28 DIAGNOSIS — R7989 Other specified abnormal findings of blood chemistry: Secondary | ICD-10-CM | POA: Diagnosis present

## 2020-07-28 DIAGNOSIS — R509 Fever, unspecified: Secondary | ICD-10-CM | POA: Diagnosis present

## 2020-07-28 DIAGNOSIS — N289 Disorder of kidney and ureter, unspecified: Secondary | ICD-10-CM | POA: Insufficient documentation

## 2020-07-28 DIAGNOSIS — Z79899 Other long term (current) drug therapy: Secondary | ICD-10-CM

## 2020-07-28 DIAGNOSIS — A419 Sepsis, unspecified organism: Secondary | ICD-10-CM | POA: Diagnosis not present

## 2020-07-28 DIAGNOSIS — E871 Hypo-osmolality and hyponatremia: Secondary | ICD-10-CM | POA: Diagnosis present

## 2020-07-28 DIAGNOSIS — E785 Hyperlipidemia, unspecified: Secondary | ICD-10-CM | POA: Diagnosis present

## 2020-07-28 DIAGNOSIS — I10 Essential (primary) hypertension: Secondary | ICD-10-CM | POA: Diagnosis present

## 2020-07-28 DIAGNOSIS — R0602 Shortness of breath: Secondary | ICD-10-CM | POA: Diagnosis not present

## 2020-07-28 DIAGNOSIS — J189 Pneumonia, unspecified organism: Secondary | ICD-10-CM

## 2020-07-28 DIAGNOSIS — M549 Dorsalgia, unspecified: Secondary | ICD-10-CM | POA: Diagnosis not present

## 2020-07-28 DIAGNOSIS — F32A Depression, unspecified: Secondary | ICD-10-CM | POA: Diagnosis present

## 2020-07-28 DIAGNOSIS — K5900 Constipation, unspecified: Secondary | ICD-10-CM | POA: Diagnosis present

## 2020-07-28 DIAGNOSIS — E119 Type 2 diabetes mellitus without complications: Secondary | ICD-10-CM

## 2020-07-28 DIAGNOSIS — Z20822 Contact with and (suspected) exposure to covid-19: Secondary | ICD-10-CM | POA: Diagnosis present

## 2020-07-28 DIAGNOSIS — B349 Viral infection, unspecified: Secondary | ICD-10-CM | POA: Diagnosis present

## 2020-07-28 DIAGNOSIS — E860 Dehydration: Secondary | ICD-10-CM | POA: Diagnosis present

## 2020-07-28 DIAGNOSIS — D649 Anemia, unspecified: Secondary | ICD-10-CM | POA: Diagnosis present

## 2020-07-28 DIAGNOSIS — I340 Nonrheumatic mitral (valve) insufficiency: Secondary | ICD-10-CM | POA: Diagnosis not present

## 2020-07-28 DIAGNOSIS — E876 Hypokalemia: Secondary | ICD-10-CM | POA: Diagnosis present

## 2020-07-28 DIAGNOSIS — M545 Low back pain, unspecified: Secondary | ICD-10-CM | POA: Diagnosis present

## 2020-07-28 DIAGNOSIS — N179 Acute kidney failure, unspecified: Secondary | ICD-10-CM | POA: Diagnosis present

## 2020-07-28 DIAGNOSIS — L409 Psoriasis, unspecified: Secondary | ICD-10-CM | POA: Diagnosis present

## 2020-07-28 DIAGNOSIS — R918 Other nonspecific abnormal finding of lung field: Secondary | ICD-10-CM | POA: Diagnosis not present

## 2020-07-28 HISTORY — DX: Disorder of kidney and ureter, unspecified: N28.9

## 2020-07-28 HISTORY — DX: Essential (primary) hypertension: I10

## 2020-07-28 HISTORY — DX: Pure hypercholesterolemia, unspecified: E78.00

## 2020-07-28 HISTORY — DX: Pneumonia, unspecified organism: J18.9

## 2020-07-28 HISTORY — DX: Type 2 diabetes mellitus without complications: E11.9

## 2020-07-28 LAB — LACTIC ACID, PLASMA
Lactic Acid, Venous: 1.8 mmol/L (ref 0.5–1.9)
Lactic Acid, Venous: 2.4 mmol/L (ref 0.5–1.9)

## 2020-07-28 LAB — CBC
HCT: 38.3 % — ABNORMAL LOW (ref 39.0–52.0)
Hemoglobin: 12.8 g/dL — ABNORMAL LOW (ref 13.0–17.0)
MCH: 31.3 pg (ref 26.0–34.0)
MCHC: 33.4 g/dL (ref 30.0–36.0)
MCV: 93.6 fL (ref 80.0–100.0)
Platelets: 432 10*3/uL — ABNORMAL HIGH (ref 150–400)
RBC: 4.09 MIL/uL — ABNORMAL LOW (ref 4.22–5.81)
RDW: 12.1 % (ref 11.5–15.5)
WBC: 14.7 10*3/uL — ABNORMAL HIGH (ref 4.0–10.5)
nRBC: 0 % (ref 0.0–0.2)

## 2020-07-28 LAB — COMPREHENSIVE METABOLIC PANEL
ALT: 57 U/L — ABNORMAL HIGH (ref 0–44)
AST: 54 U/L — ABNORMAL HIGH (ref 15–41)
Albumin: 2.3 g/dL — ABNORMAL LOW (ref 3.5–5.0)
Alkaline Phosphatase: 172 U/L — ABNORMAL HIGH (ref 38–126)
Anion gap: 14 (ref 5–15)
BUN: 28 mg/dL — ABNORMAL HIGH (ref 8–23)
CO2: 24 mmol/L (ref 22–32)
Calcium: 8.5 mg/dL — ABNORMAL LOW (ref 8.9–10.3)
Chloride: 91 mmol/L — ABNORMAL LOW (ref 98–111)
Creatinine, Ser: 1.14 mg/dL (ref 0.61–1.24)
GFR, Estimated: >60 mL/min (ref >60)
Glucose, Bld: 256 mg/dL — ABNORMAL HIGH (ref 70–99)
Potassium: 3.4 mmol/L — ABNORMAL LOW (ref 3.5–5.1)
Sodium: 129 mmol/L — ABNORMAL LOW (ref 135–145)
Total Bilirubin: 0.6 mg/dL (ref 0.3–1.2)
Total Protein: 6.1 g/dL — ABNORMAL LOW (ref 6.5–8.1)

## 2020-07-28 LAB — CBC WITH DIFFERENTIAL/PLATELET
Abs Immature Granulocytes: 0.42 10*3/uL — ABNORMAL HIGH (ref 0.00–0.07)
Basophils Absolute: 0.1 10*3/uL (ref 0.0–0.1)
Basophils Relative: 0 %
Eosinophils Absolute: 0.3 10*3/uL (ref 0.0–0.5)
Eosinophils Relative: 2 %
HCT: 40 % (ref 39.0–52.0)
Hemoglobin: 13.8 g/dL (ref 13.0–17.0)
Immature Granulocytes: 2 %
Lymphocytes Relative: 5 %
Lymphs Abs: 0.9 10*3/uL (ref 0.7–4.0)
MCH: 31.4 pg (ref 26.0–34.0)
MCHC: 34.5 g/dL (ref 30.0–36.0)
MCV: 91.1 fL (ref 80.0–100.0)
Monocytes Absolute: 1 10*3/uL (ref 0.1–1.0)
Monocytes Relative: 6 %
Neutro Abs: 14.6 10*3/uL — ABNORMAL HIGH (ref 1.7–7.7)
Neutrophils Relative %: 85 %
Platelets: 515 10*3/uL — ABNORMAL HIGH (ref 150–400)
RBC: 4.39 MIL/uL (ref 4.22–5.81)
RDW: 12 % (ref 11.5–15.5)
WBC: 17.2 10*3/uL — ABNORMAL HIGH (ref 4.0–10.5)
nRBC: 0 % (ref 0.0–0.2)

## 2020-07-28 LAB — RESPIRATORY PANEL BY RT PCR (FLU A&B, COVID)
Influenza A by PCR: NEGATIVE
Influenza B by PCR: NEGATIVE
SARS Coronavirus 2 by RT PCR: NEGATIVE

## 2020-07-28 LAB — URINALYSIS, ROUTINE W REFLEX MICROSCOPIC
Bacteria, UA: NONE SEEN
Bilirubin Urine: NEGATIVE
Glucose, UA: NEGATIVE mg/dL
Hgb urine dipstick: NEGATIVE
Ketones, ur: NEGATIVE mg/dL
Leukocytes,Ua: NEGATIVE
Nitrite: NEGATIVE
Protein, ur: 30 mg/dL — AB
Specific Gravity, Urine: 1.024 (ref 1.005–1.030)
pH: 5 (ref 5.0–8.0)

## 2020-07-28 LAB — FIBRINOGEN: Fibrinogen: >800 mg/dL — ABNORMAL HIGH (ref 210–475)

## 2020-07-28 LAB — FERRITIN: Ferritin: 1031 ng/mL — ABNORMAL HIGH (ref 24–336)

## 2020-07-28 LAB — TROPONIN I (HIGH SENSITIVITY)
Troponin I (High Sensitivity): 17 ng/L (ref <18)
Troponin I (High Sensitivity): 13 ng/L (ref <18)
Troponin I (High Sensitivity): 17 ng/L (ref <18)

## 2020-07-28 LAB — LIPASE, BLOOD: Lipase: 20 U/L (ref 11–51)

## 2020-07-28 LAB — D-DIMER, QUANTITATIVE: D-Dimer, Quant: 4.84 ug/mL-FEU — ABNORMAL HIGH (ref 0.00–0.50)

## 2020-07-28 LAB — C-REACTIVE PROTEIN: CRP: 28.1 mg/dL — ABNORMAL HIGH (ref <1.0)

## 2020-07-28 LAB — PROCALCITONIN: Procalcitonin: 0.95 ng/mL

## 2020-07-28 LAB — MRSA PCR SCREENING: MRSA by PCR: NEGATIVE

## 2020-07-28 LAB — TRIGLYCERIDES: Triglycerides: 90 mg/dL (ref <150)

## 2020-07-28 LAB — LACTATE DEHYDROGENASE: LDH: 453 U/L — ABNORMAL HIGH (ref 98–192)

## 2020-07-28 IMAGING — CT CT L SPINE W/O CM
3 series · 12 of 33 positions shown, 14 images · IV contrast (OMNI 350)
Comparison: None.

CLINICAL DATA: Sepsis. Possible GI source. Back pain and fever with
diaphoresis.

EXAM:
CT ANGIOGRAPHY CHEST
CT ANGIOGRAPHY ABDOMEN AND PELVIS WITH CONTRAST
CT LUMBAR SPINE WITHOUT CONTRAST
TECHNIQUE: Multidetector CT imaging of the chest was performed using the
standard protocol during bolus administration of intravenous
contrast. Multiplanar CT image reconstructions and MIPs were
obtained to evaluate the vascular anatomy. Multidetector CT imaging
of the abdomen and pelvis was performed using the standard protocol
during bolus administration of intravenous contrast.
Multiplanar CT images of the lumbar spine was reconstructed from
contemporary CT of the Chest, Abdomen, and Pelvis
CONTRAST:  100mL OMNIPAQUE IOHEXOL 350 MG/ML SOLN

[Series 1: dissection 2mm · axial · 0.38mm/px · z∈[+991,+1183]mm · 4 of 140 slices shown, 5 images]
[im 22/140  soft-tissue]
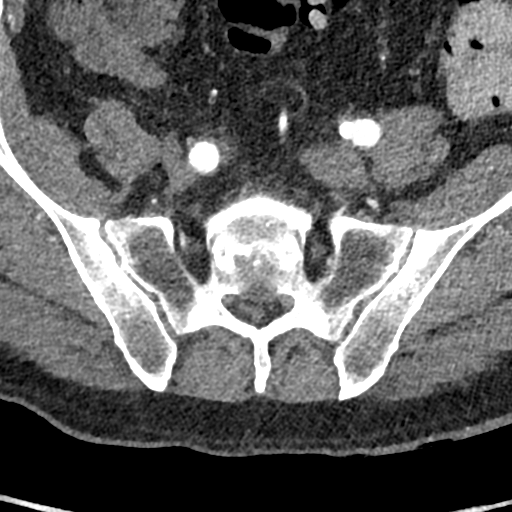
[im 22/140  bone]
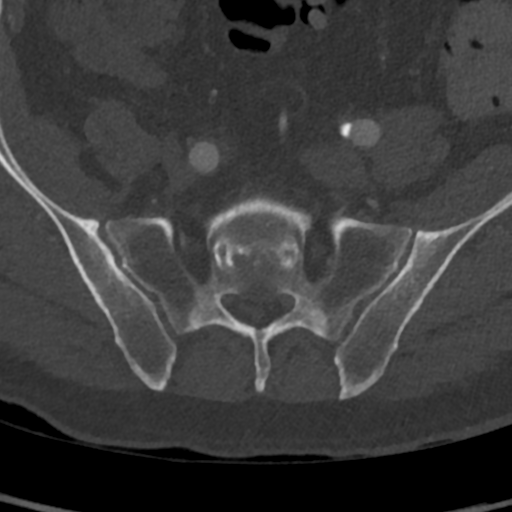
[im 54/140  bone]
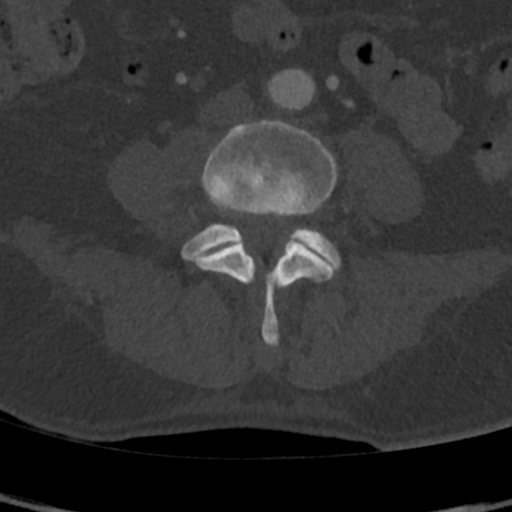
[im 86/140  bone]
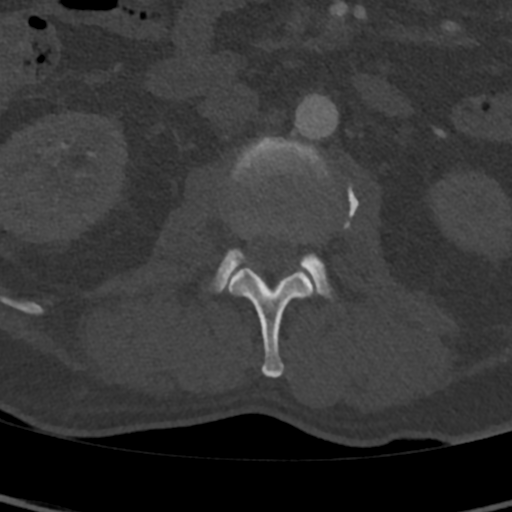
[im 118/140  bone]
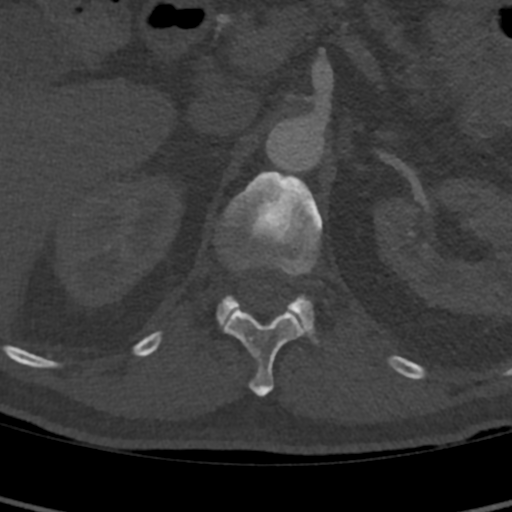

[Series 5: l-spine bone cor · coronal · 0.38mm/px · 3 of 99 slices shown]
[im 20/99  bone]
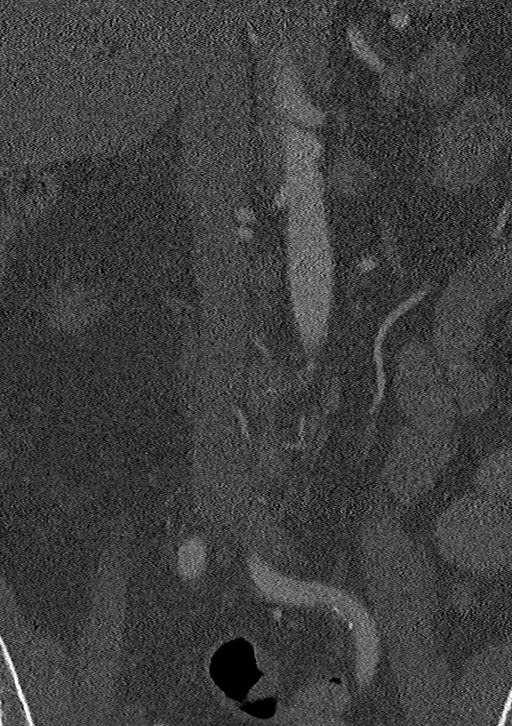
[im 40/99  bone]
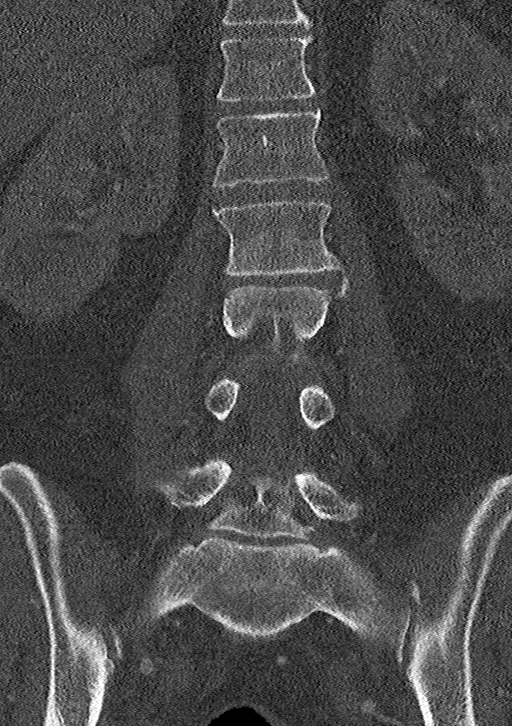
[im 59/99  bone]
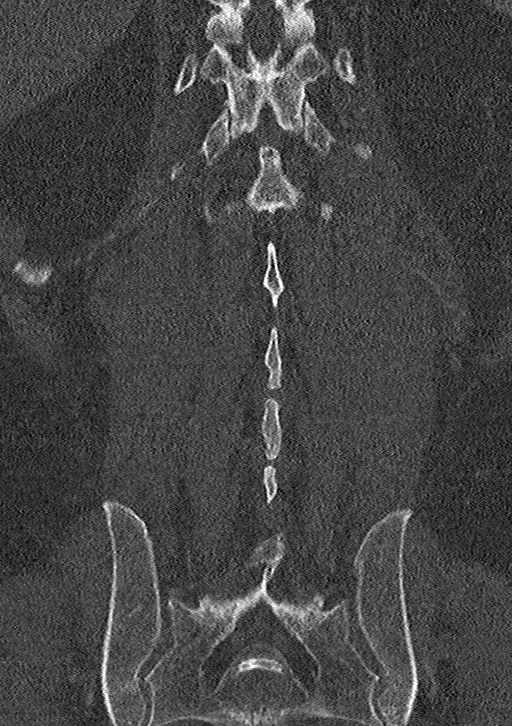

[Series 6: l-spine bone sag · sagittal · 0.40mm/px · 5 of 103 slices shown, 6 images]
[im 35/103  bone]
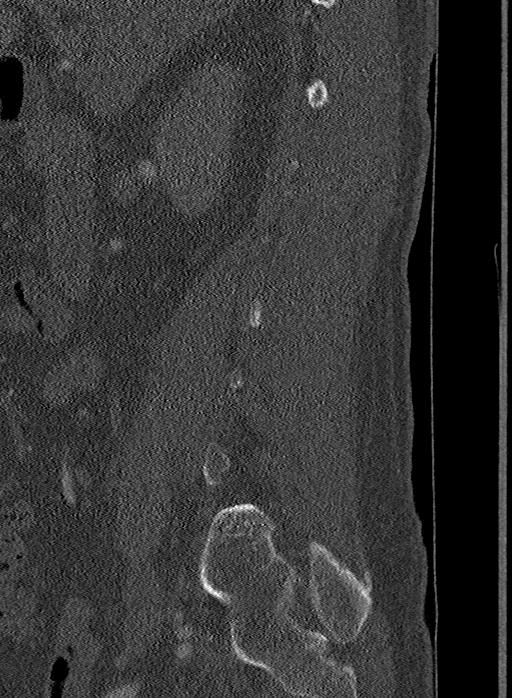
[im 43/103  bone]
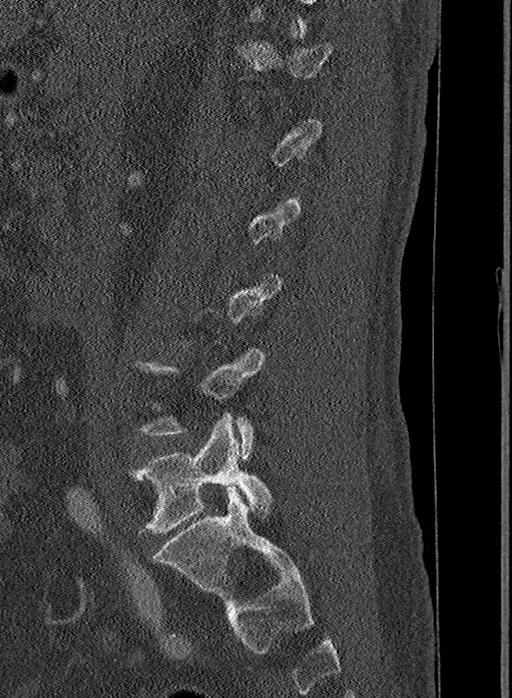
[im 52/103  soft-tissue]
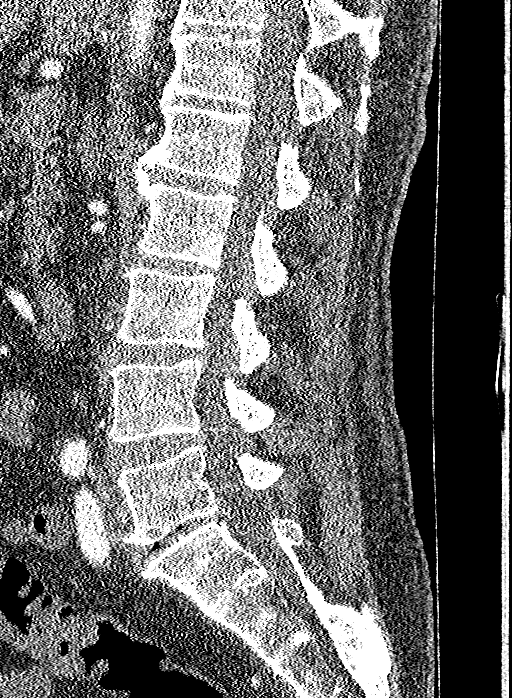
[im 52/103  bone]
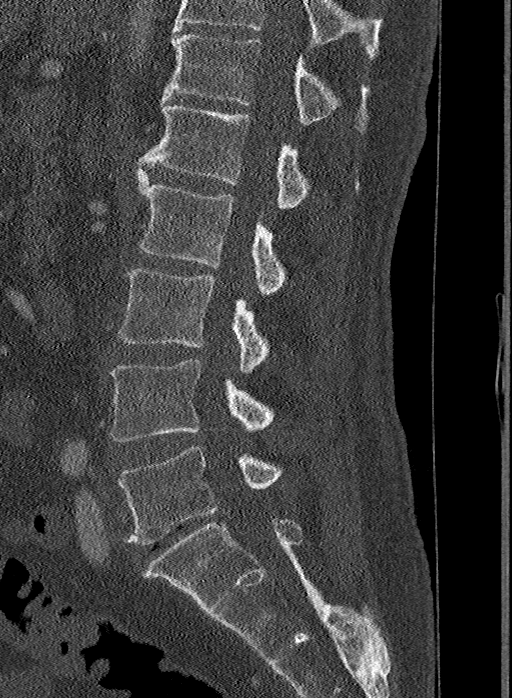
[im 60/103  bone]
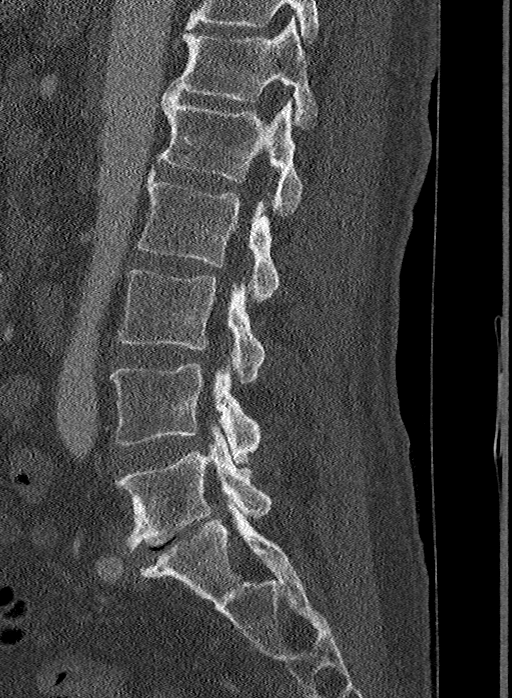
[im 69/103  bone]
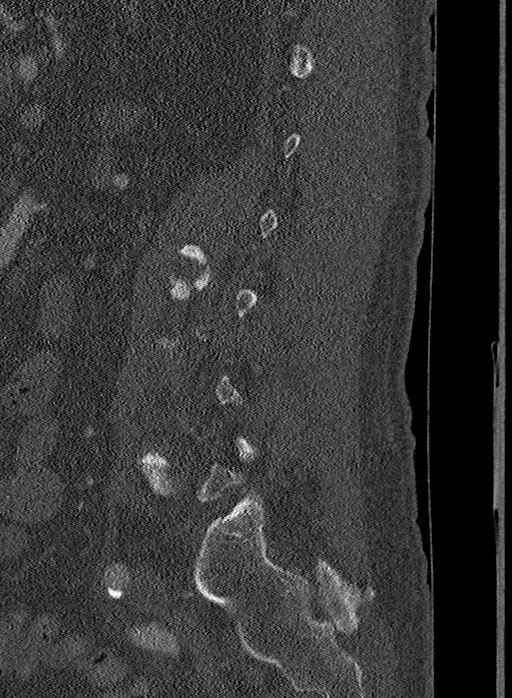

[12 of 33 positions shown; findings below may reference images not displayed]

FINDINGS: CTA CHEST FINDINGS

Cardiovascular: Contrast injection is sufficient to demonstrate
satisfactory opacification of the pulmonary arteries to the
segmental level. There is no pulmonary embolus or evidence of right
heart strain. The size of the main pulmonary artery is normal. Heart
size is normal. Coronary artery calcifications are noted. The course
and caliber of the aorta are normal. There is no atherosclerotic
calcification. Opacification decreased due to pulmonary arterial
phase contrast bolus timing.

Mediastinum/Nodes:

-- No mediastinal lymphadenopathy.

-- No hilar lymphadenopathy.

-- No axillary lymphadenopathy.

-- No supraclavicular lymphadenopathy.

-- Normal thyroid gland where visualized.

-  Unremarkable esophagus.

Lungs/Pleura: There is atelectasis at the lung bases. There are
trace bilateral pleural effusions. There is no pneumothorax.

Musculoskeletal: No chest wall abnormality. No bony spinal canal
stenosis.

Review of the MIP images confirms the above findings.

CT ABDOMEN and PELVIS FINDINGS

Hepatobiliary: The liver is normal. Normal gallbladder.There is no
biliary ductal dilation.

Pancreas: Normal contours without ductal dilatation. No
peripancreatic fluid collection.

Spleen: Unremarkable.

Adrenals/Urinary Tract:

--Adrenal glands: Unremarkable.

--Right kidney/ureter: No hydronephrosis or radiopaque kidney
stones.

--Left kidney/ureter: No hydronephrosis or radiopaque kidney stones.

--Urinary bladder: Unremarkable.

Stomach/Bowel:

--Stomach/Duodenum: No hiatal hernia or other gastric abnormality.
Normal duodenal course and caliber.

--Small bowel: Unremarkable.

--Colon: There is a large amount of stool in the colon.

--Appendix: Normal.

Vascular/Lymphatic: Atherosclerotic calcification is present within
the non-aneurysmal abdominal aorta, without hemodynamically
significant stenosis.

--No retroperitoneal lymphadenopathy.

--No mesenteric lymphadenopathy.

--No pelvic or inguinal lymphadenopathy.

Reproductive: The prostate gland is enlarged.

Other: There is a small amount of free fluid in the patient's pelvis
of unknown clinical significance. The abdominal wall is normal.

Musculoskeletal. No acute displaced fractures. There is
mild-to-moderate disc height loss at the L5-S1 level. The remaining
disc heights are relatively well preserved.

Review of the MIP images confirms the above findings.
IMPRESSION: 1. No evidence of pulmonary embolus.
2. Trace bilateral pleural effusions with atelectasis at the lung
bases.
3. No acute intra-abdominal or pelvic process.
4. No acute abnormality identified involving the lumbar spine.
5. Large amount of stool in the colon.
6. Prostatomegaly.
7. Small amount of free fluid in the patient's pelvis of unknown
clinical significance.

Aortic Atherosclerosis ([Q4]-[Q4]).

## 2020-07-28 IMAGING — CT CT ANGIO CHEST
3 of 7 series · 17 of 36 positions shown · IV contrast (OMNI 350)
Comparison: None.

CLINICAL DATA: Sepsis. Possible GI source. Back pain and fever with
diaphoresis.

EXAM:
CT ANGIOGRAPHY CHEST
CT ANGIOGRAPHY ABDOMEN AND PELVIS WITH CONTRAST
CT LUMBAR SPINE WITHOUT CONTRAST
TECHNIQUE: Multidetector CT imaging of the chest was performed using the
standard protocol during bolus administration of intravenous
contrast. Multiplanar CT image reconstructions and MIPs were
obtained to evaluate the vascular anatomy. Multidetector CT imaging
of the abdomen and pelvis was performed using the standard protocol
during bolus administration of intravenous contrast.
Multiplanar CT images of the lumbar spine was reconstructed from
contemporary CT of the Chest, Abdomen, and Pelvis
CONTRAST:  100mL OMNIPAQUE IOHEXOL 350 MG/ML SOLN

[Series 1: lung · axial · 0.69mm/px · z∈[+1255,+1393]mm · 4 of 139 slices shown]
[im 24/139  mediastinal]
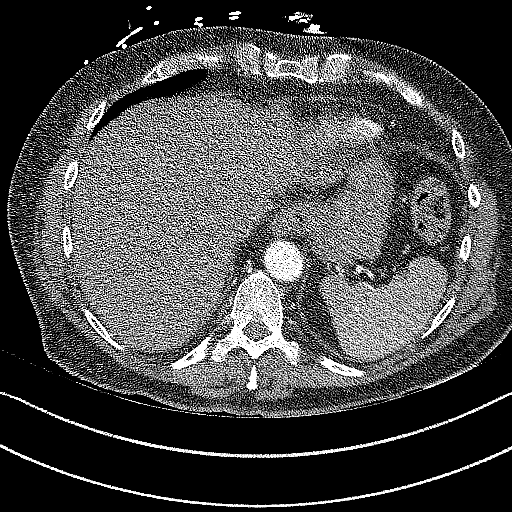
[im 47/139  mediastinal]
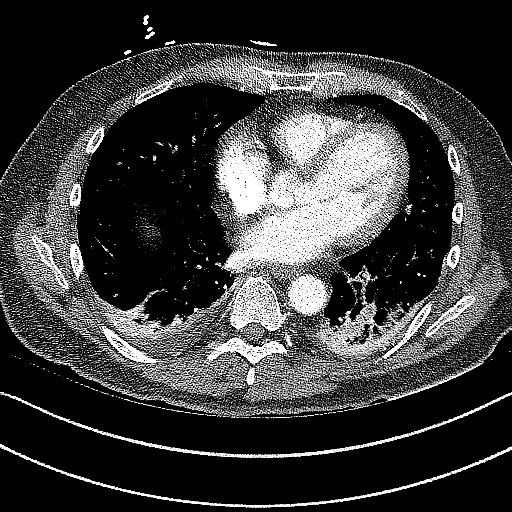
[im 70/139  mediastinal]
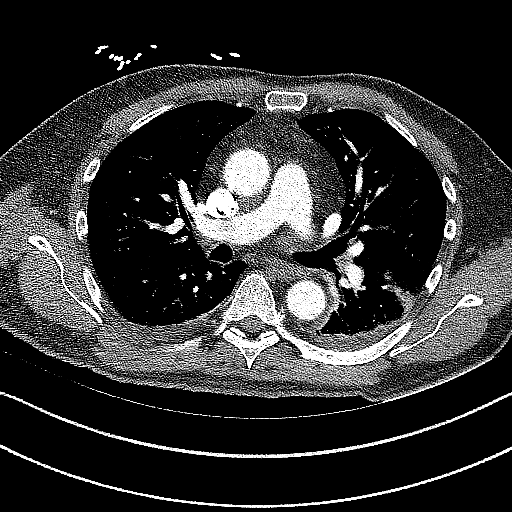
[im 93/139  mediastinal]
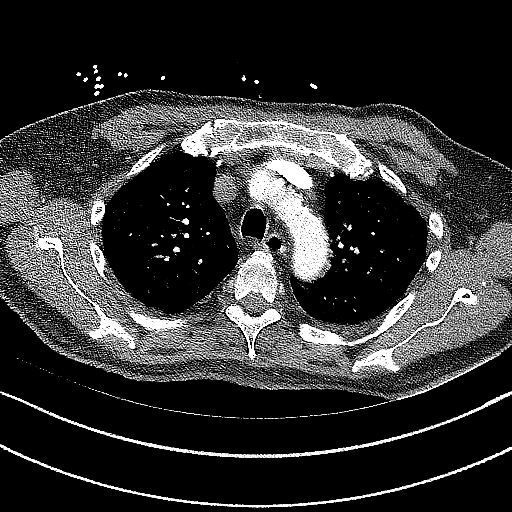

[Series 5: pe 2mm · axial · 0.91mm/px · z∈[+1250,+1469]mm · 12 of 259 slices shown]
[im 20/259  lung]
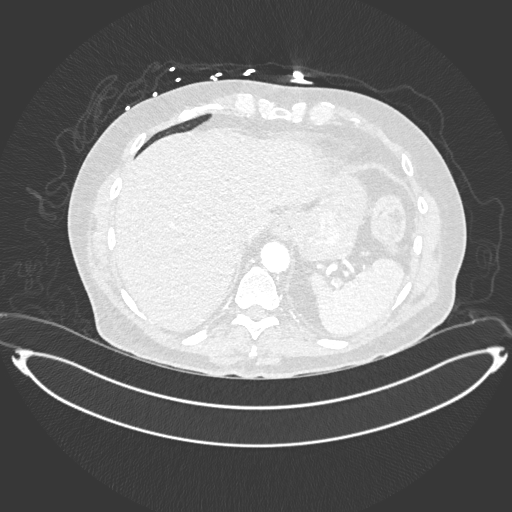
[im 40/259  mediastinal]
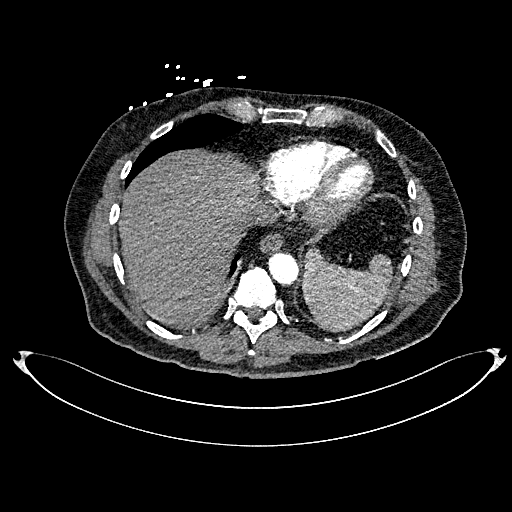
[im 60/259  lung]
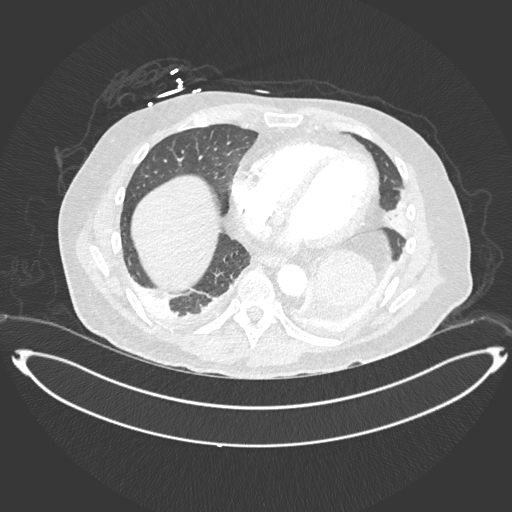
[im 80/259  mediastinal]
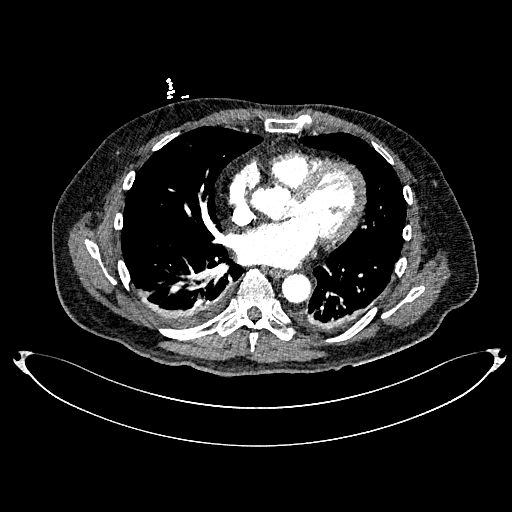
[im 100/259  lung]
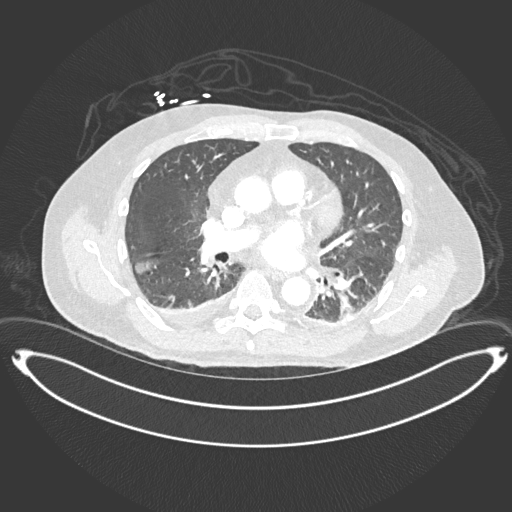
[im 120/259  mediastinal]
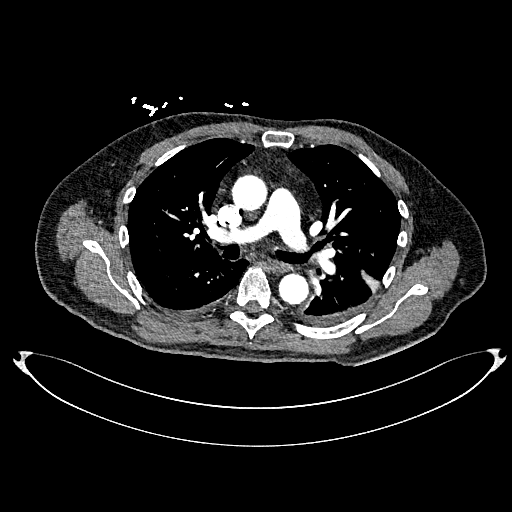
[im 139/259  lung]
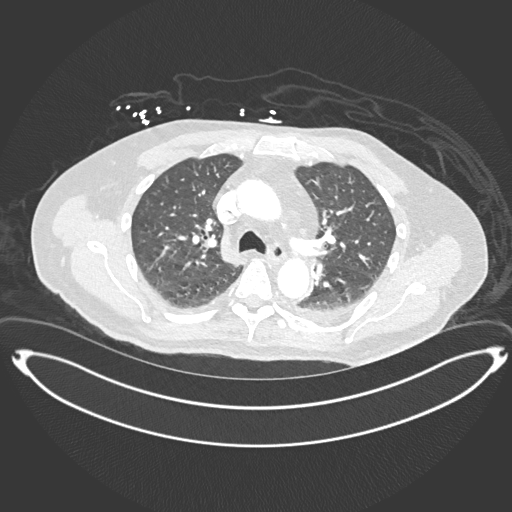
[im 159/259  mediastinal]
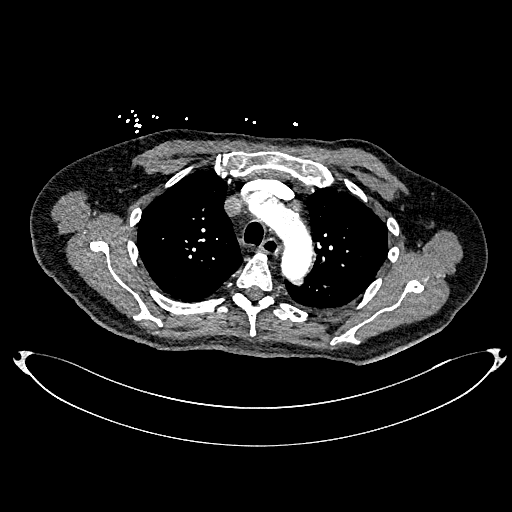
[im 179/259  lung]
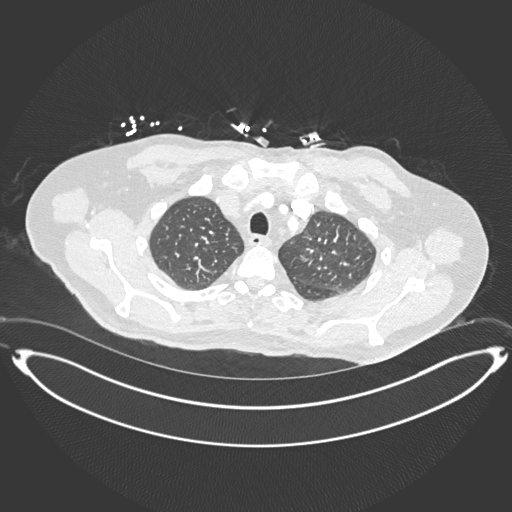
[im 199/259  mediastinal]
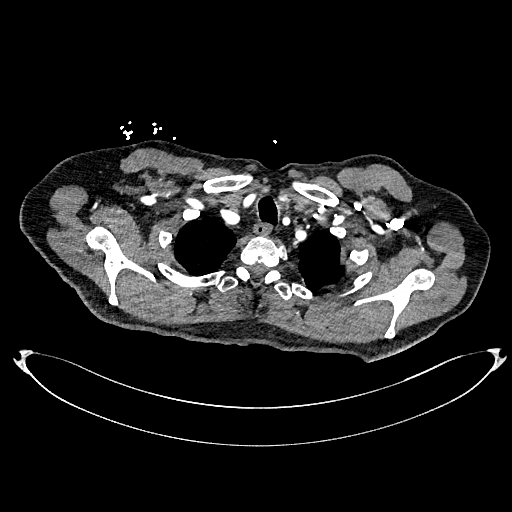
[im 219/259  lung]
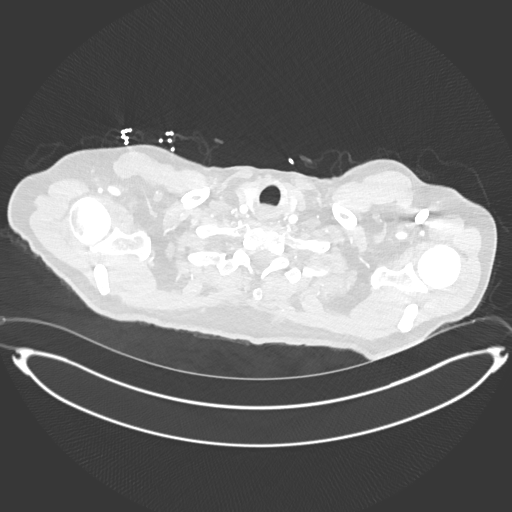
[im 239/259  mediastinal]
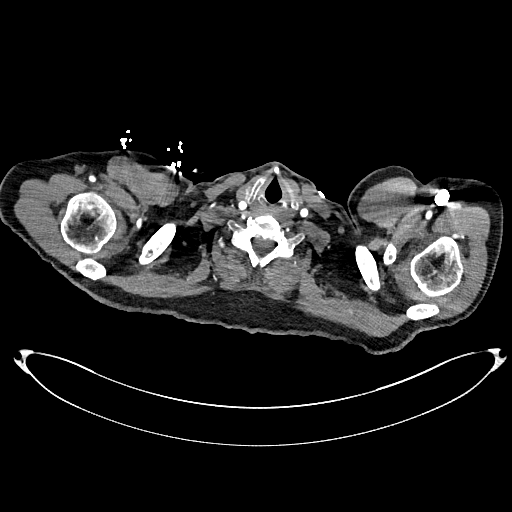

[Series 6: pe 2mm cor · coronal · 0.50mm/px · 1 of 170 slices shown]
[im 85/170  mediastinal]
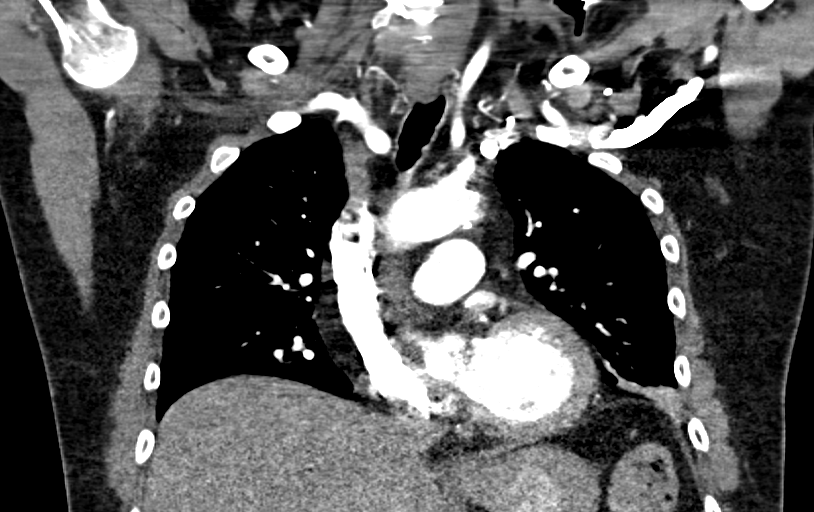

[17 of 36 positions shown; findings below may reference images not displayed]

FINDINGS: CTA CHEST FINDINGS

Cardiovascular: Contrast injection is sufficient to demonstrate
satisfactory opacification of the pulmonary arteries to the
segmental level. There is no pulmonary embolus or evidence of right
heart strain. The size of the main pulmonary artery is normal. Heart
size is normal. Coronary artery calcifications are noted. The course
and caliber of the aorta are normal. There is no atherosclerotic
calcification. Opacification decreased due to pulmonary arterial
phase contrast bolus timing.

Mediastinum/Nodes:

-- No mediastinal lymphadenopathy.

-- No hilar lymphadenopathy.

-- No axillary lymphadenopathy.

-- No supraclavicular lymphadenopathy.

-- Normal thyroid gland where visualized.

-  Unremarkable esophagus.

Lungs/Pleura: There is atelectasis at the lung bases. There are
trace bilateral pleural effusions. There is no pneumothorax.

Musculoskeletal: No chest wall abnormality. No bony spinal canal
stenosis.

Review of the MIP images confirms the above findings.

CT ABDOMEN and PELVIS FINDINGS

Hepatobiliary: The liver is normal. Normal gallbladder.There is no
biliary ductal dilation.

Pancreas: Normal contours without ductal dilatation. No
peripancreatic fluid collection.

Spleen: Unremarkable.

Adrenals/Urinary Tract:

--Adrenal glands: Unremarkable.

--Right kidney/ureter: No hydronephrosis or radiopaque kidney
stones.

--Left kidney/ureter: No hydronephrosis or radiopaque kidney stones.

--Urinary bladder: Unremarkable.

Stomach/Bowel:

--Stomach/Duodenum: No hiatal hernia or other gastric abnormality.
Normal duodenal course and caliber.

--Small bowel: Unremarkable.

--Colon: There is a large amount of stool in the colon.

--Appendix: Normal.

Vascular/Lymphatic: Atherosclerotic calcification is present within
the non-aneurysmal abdominal aorta, without hemodynamically
significant stenosis.

--No retroperitoneal lymphadenopathy.

--No mesenteric lymphadenopathy.

--No pelvic or inguinal lymphadenopathy.

Reproductive: The prostate gland is enlarged.

Other: There is a small amount of free fluid in the patient's pelvis
of unknown clinical significance. The abdominal wall is normal.

Musculoskeletal. No acute displaced fractures. There is
mild-to-moderate disc height loss at the L5-S1 level. The remaining
disc heights are relatively well preserved.

Review of the MIP images confirms the above findings.
IMPRESSION: 1. No evidence of pulmonary embolus.
2. Trace bilateral pleural effusions with atelectasis at the lung
bases.
3. No acute intra-abdominal or pelvic process.
4. No acute abnormality identified involving the lumbar spine.
5. Large amount of stool in the colon.
6. Prostatomegaly.
7. Small amount of free fluid in the patient's pelvis of unknown
clinical significance.

Aortic Atherosclerosis ([Q4]-[Q4]).

## 2020-07-28 IMAGING — DX DG CHEST 2V
2 series · 2 of 2 positions shown · non-contrast
Comparison: [DATE]

CLINICAL DATA: 73-year-old male with a history of pneumonia

EXAM:
CHEST - 2 VIEW

[chest lat]
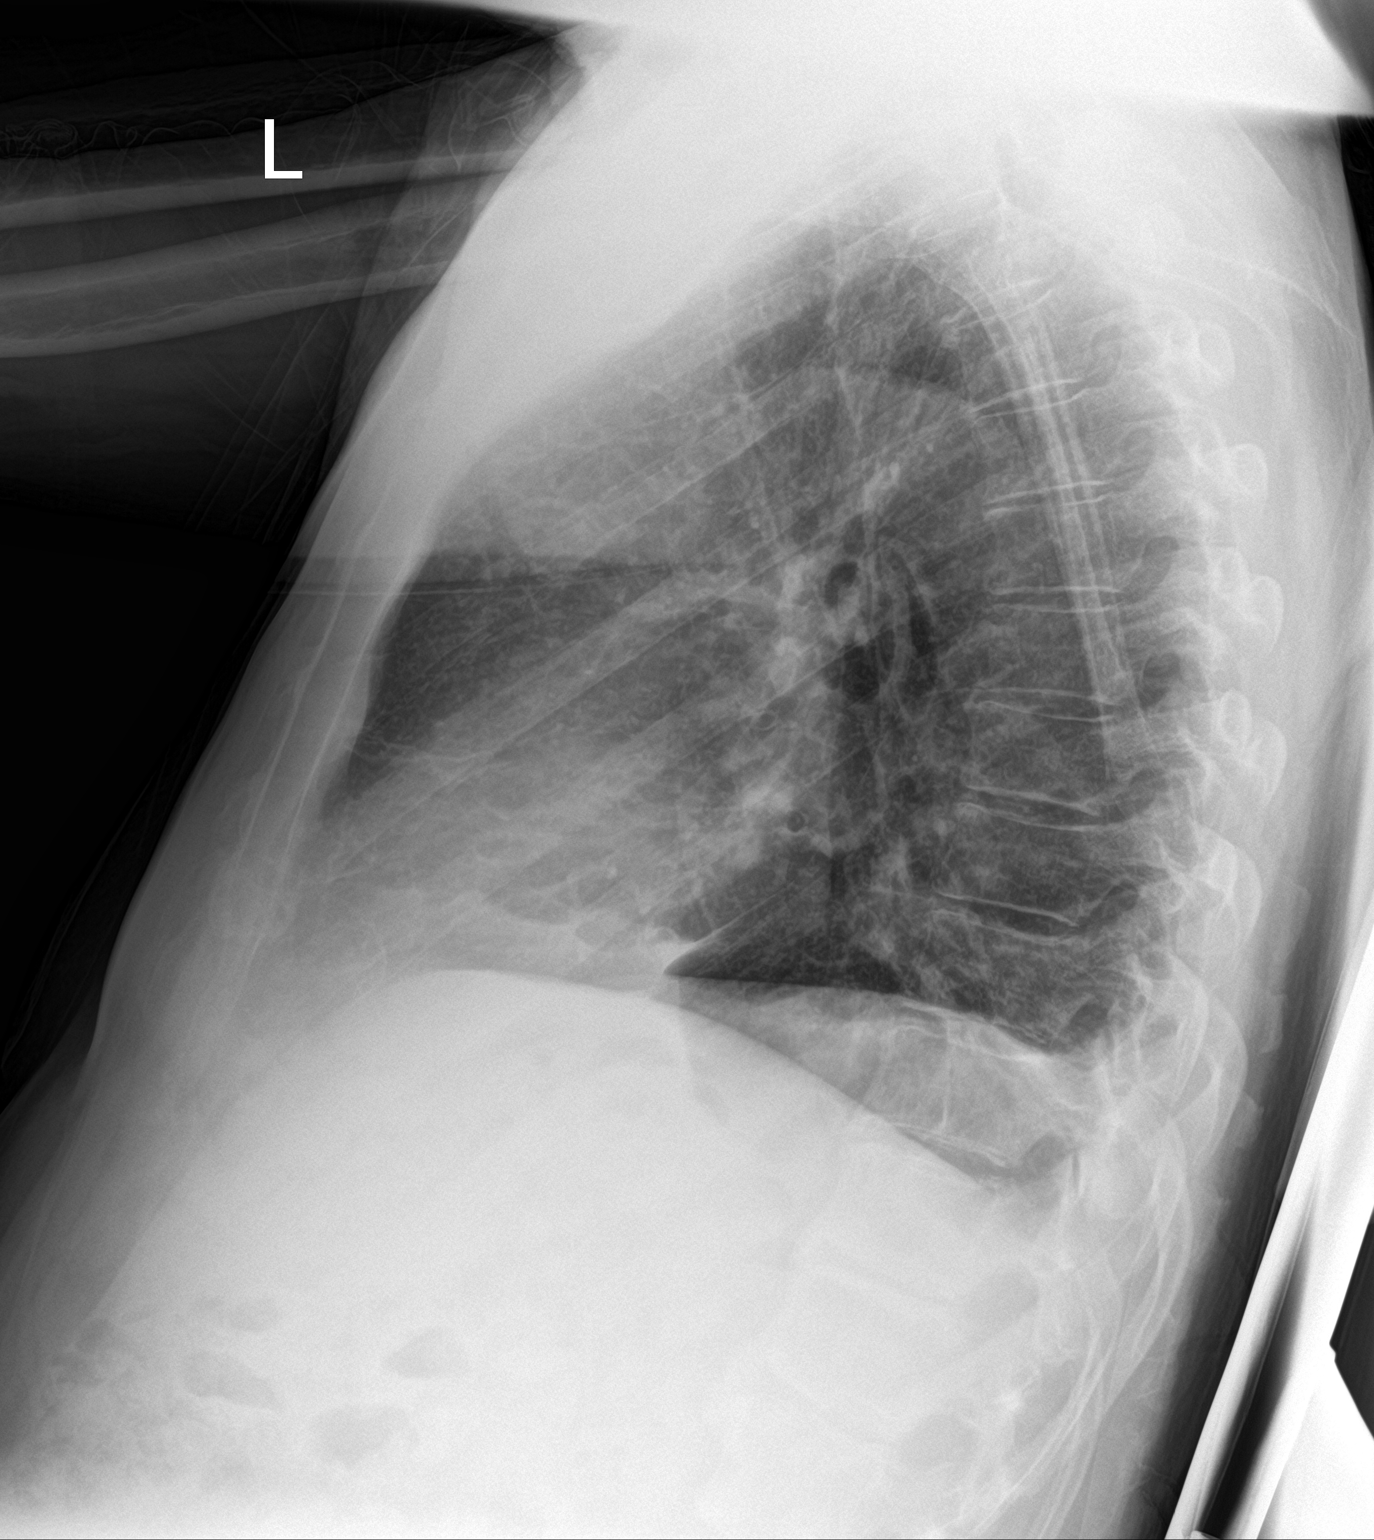

[chest ap]
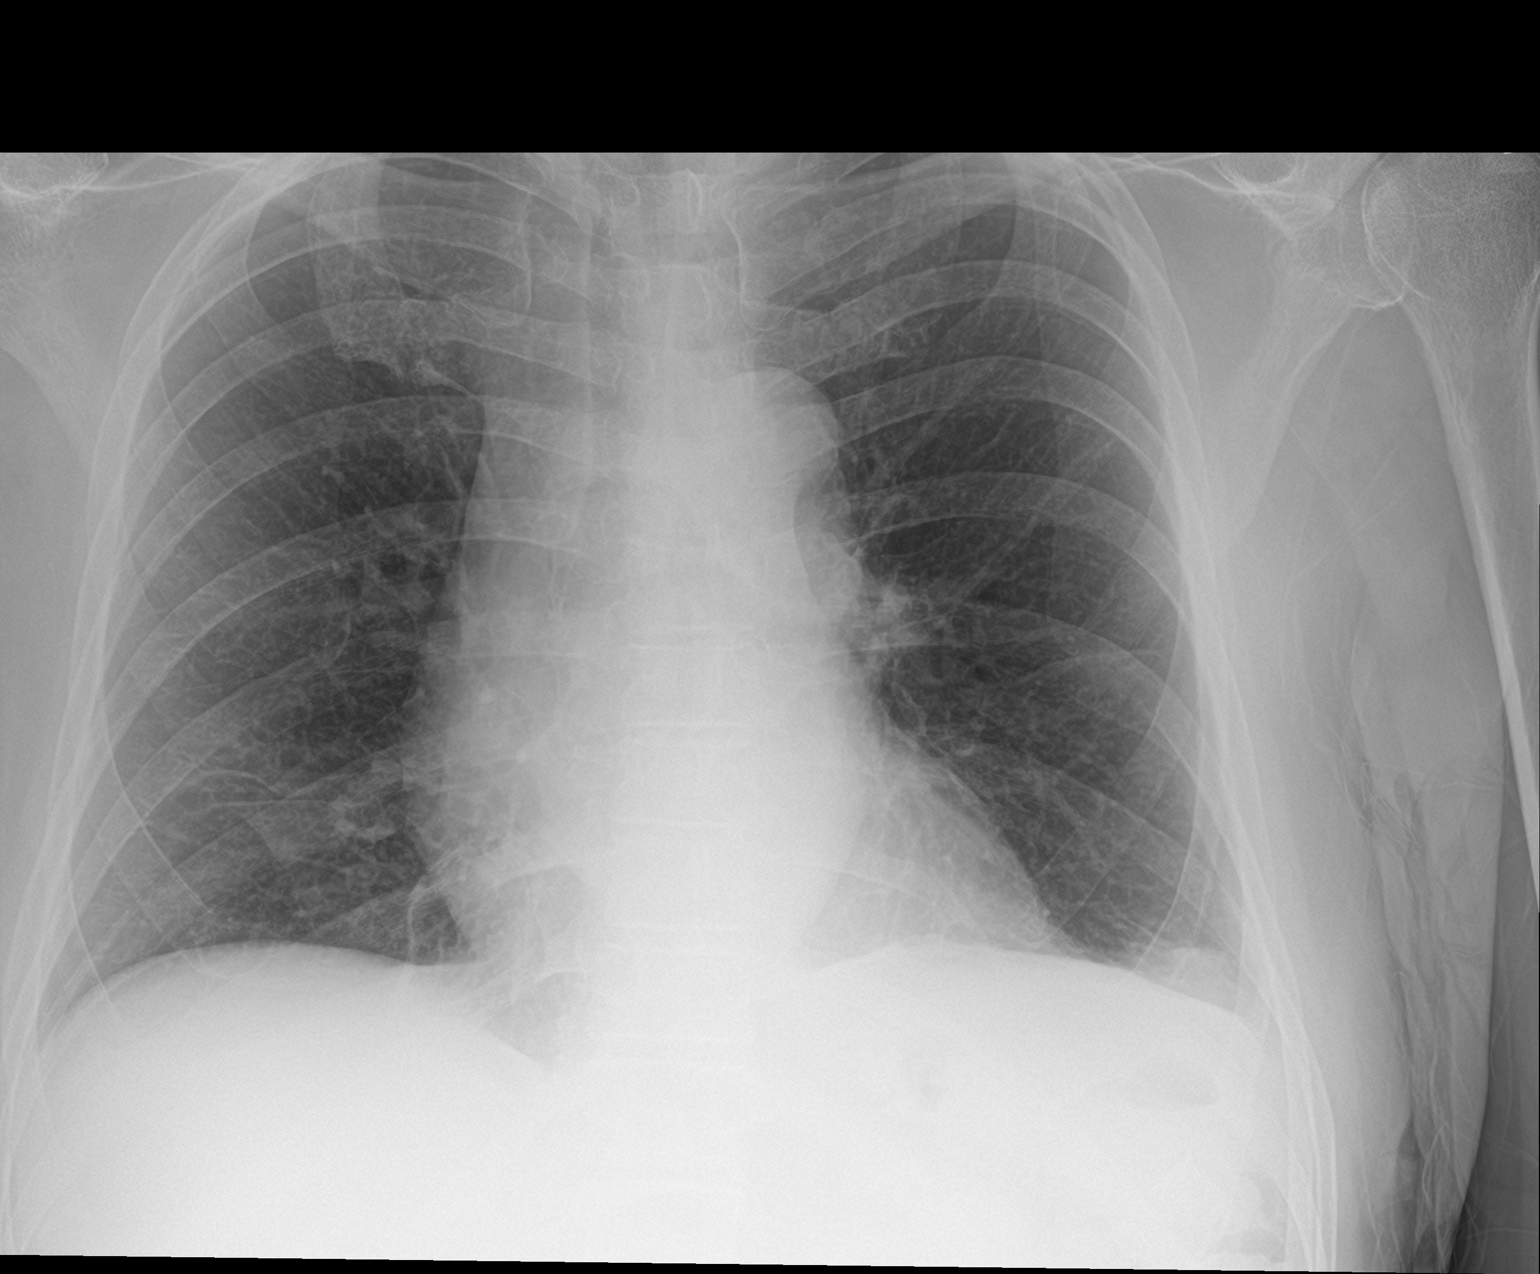

[2 of 2 positions shown; findings below may reference images not displayed]

FINDINGS: Cardiomediastinal silhouette unchanged in size and contour. No
pneumothorax. No pleural effusion.

The vague opacity at the base of the left lung is favored to be
persisting on the current plain film, slightly different appearance
at the diaphragm given the varying angulation of the acquisition.

No new confluent airspace disease. No interlobular septal
thickening.

No displaced fracture.  Degenerative changes of the spine.
IMPRESSION: Similar appearance of the lungs to the prior plain film, with vague
nodular opacity at the base of the left lung overlying the
diaphragm, potentially atelectasis/scarring or small focus of
pneumonia. Otherwise no new airspace disease or other acute changes.
As was previously stated, follow-up PA and lateral chest x-ray
recommended in 4-6 weeks.

## 2020-07-28 IMAGING — CT CT CTA ABD/PEL W/CM AND/OR W/O CM
2 of 6 series · 14 of 46 positions shown, 16 images · IV contrast (omnipaque)
Comparison: None.

CLINICAL DATA: Sepsis. Possible GI source. Back pain and fever with
diaphoresis.

EXAM:
CT ANGIOGRAPHY CHEST
CT ANGIOGRAPHY ABDOMEN AND PELVIS WITH CONTRAST
CT LUMBAR SPINE WITHOUT CONTRAST
TECHNIQUE: Multidetector CT imaging of the chest was performed using the
standard protocol during bolus administration of intravenous
contrast. Multiplanar CT image reconstructions and MIPs were
obtained to evaluate the vascular anatomy. Multidetector CT imaging
of the abdomen and pelvis was performed using the standard protocol
during bolus administration of intravenous contrast.
Multiplanar CT images of the lumbar spine was reconstructed from
contemporary CT of the Chest, Abdomen, and Pelvis
CONTRAST:  100mL OMNIPAQUE IOHEXOL 350 MG/ML SOLN

[Series 5: dissection 2mm · axial · 0.91mm/px · z∈[+819,+1265]mm · 11 of 265 slices shown, 13 images]
[im 21/265  soft-tissue]
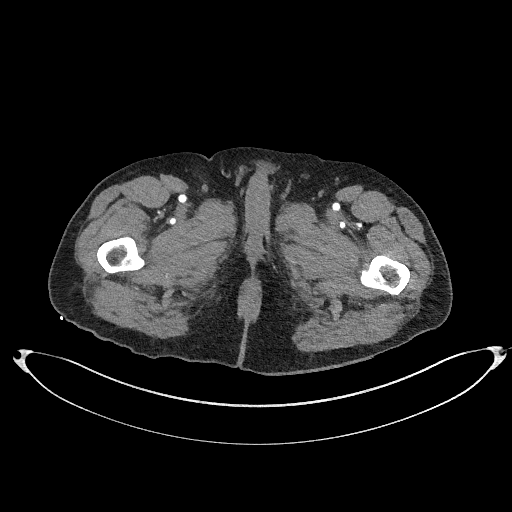
[im 21/265  bone]
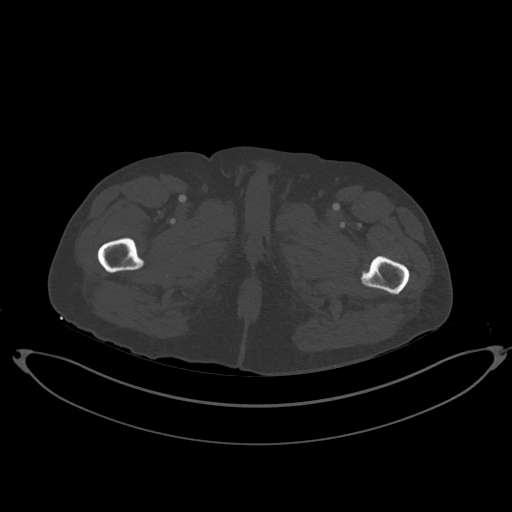
[im 41/265  soft-tissue]
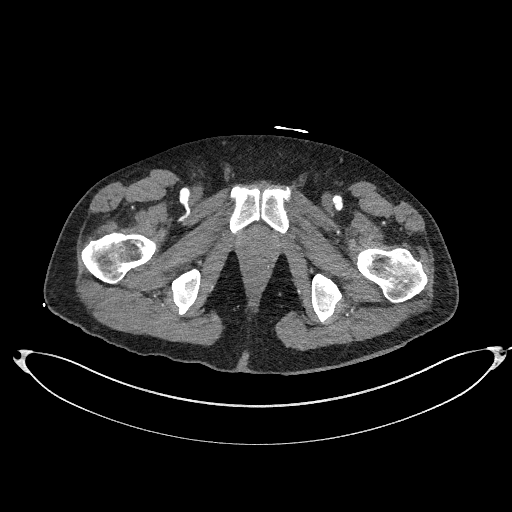
[im 61/265  soft-tissue]
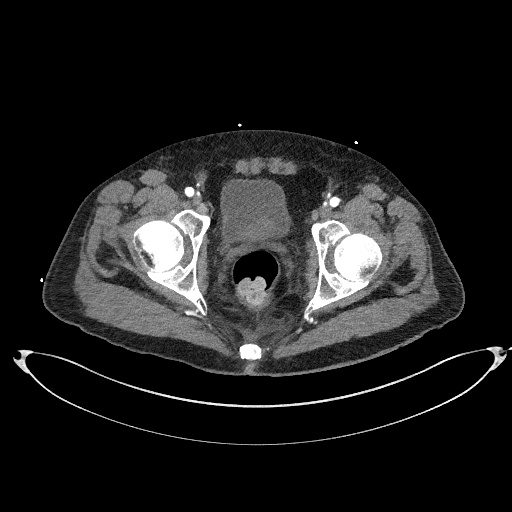
[im 82/265  soft-tissue]
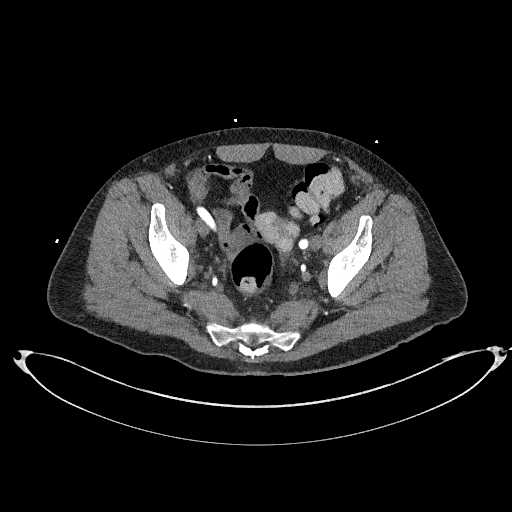
[im 112/265  soft-tissue]
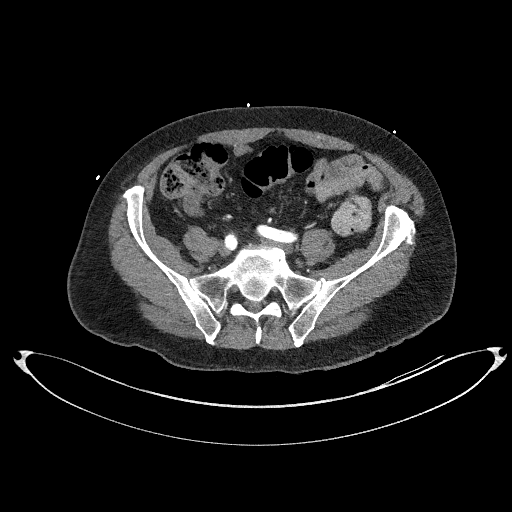
[im 133/265  soft-tissue]
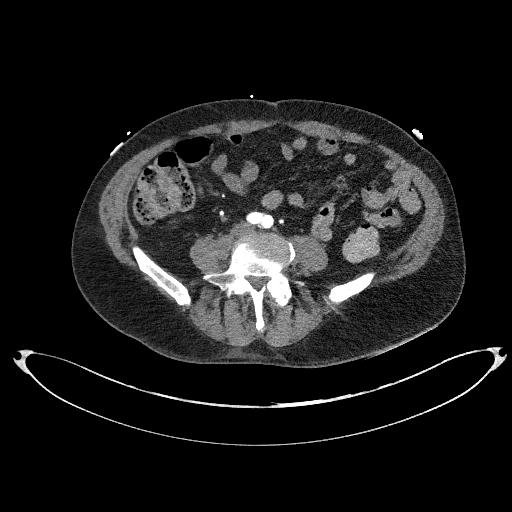
[im 153/265  soft-tissue]
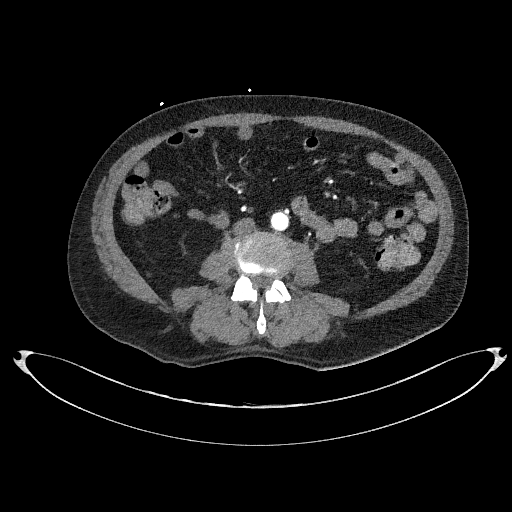
[im 183/265  soft-tissue]
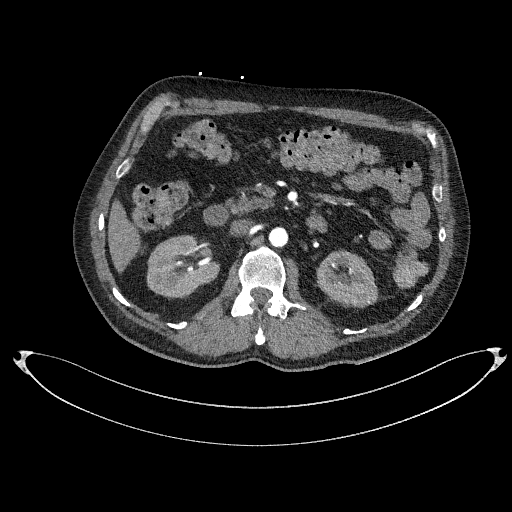
[im 204/265  soft-tissue]
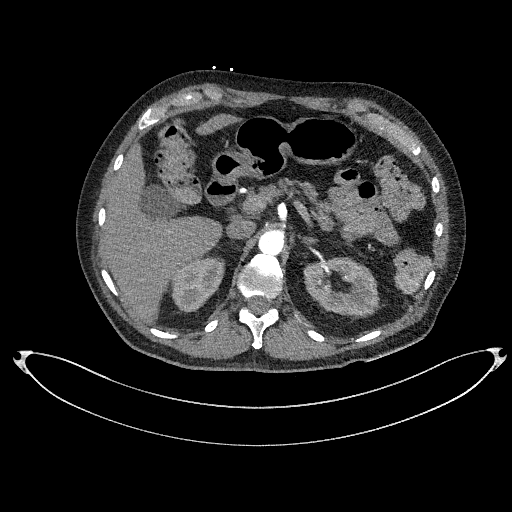
[im 204/265  bone]
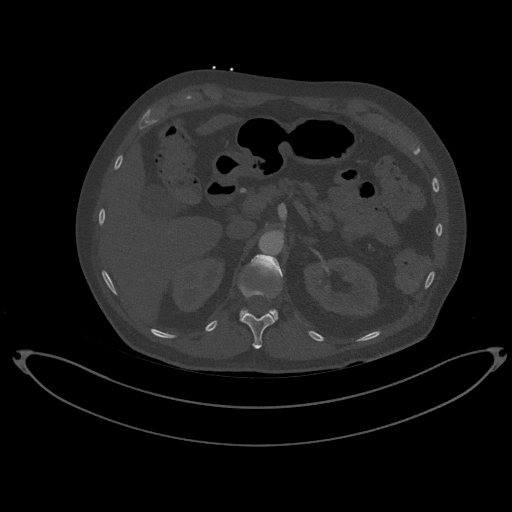
[im 224/265  soft-tissue]
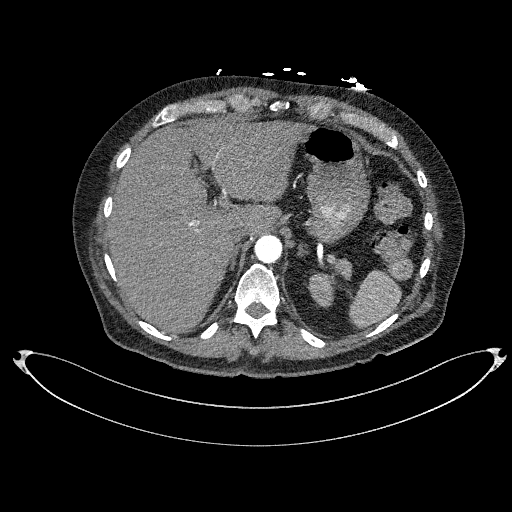
[im 244/265  soft-tissue]
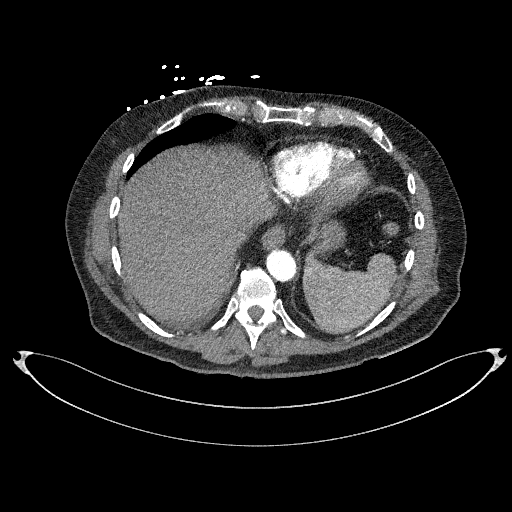

[Series 8: dissection 2mm cor · coronal · 0.83mm/px · 3 of 167 slices shown]
[im 42/167  soft-tissue]
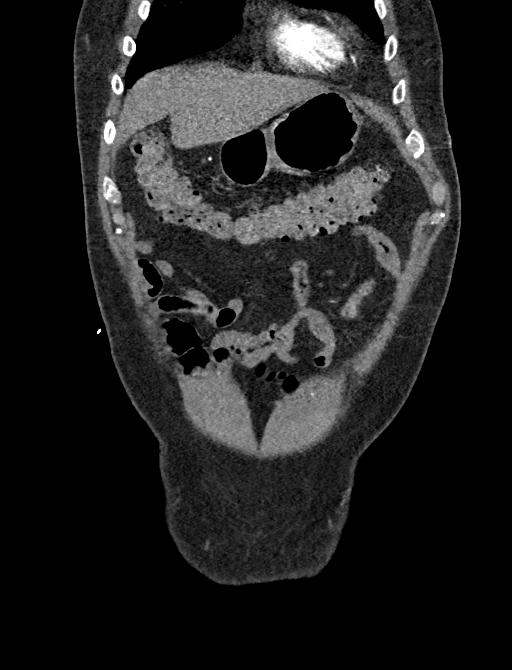
[im 84/167  soft-tissue]
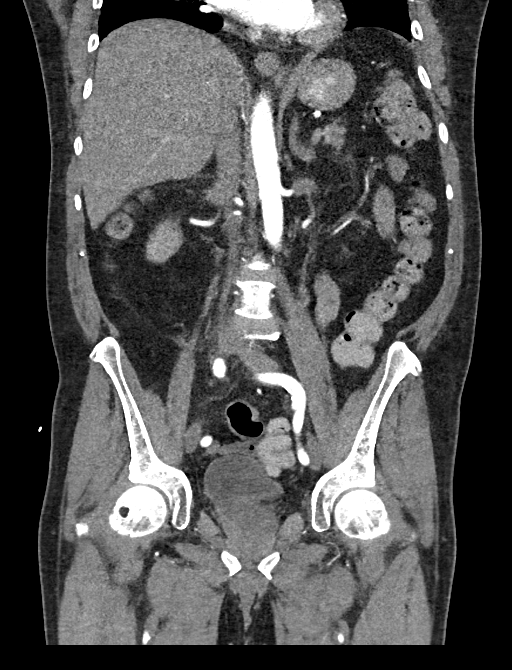
[im 125/167  soft-tissue]
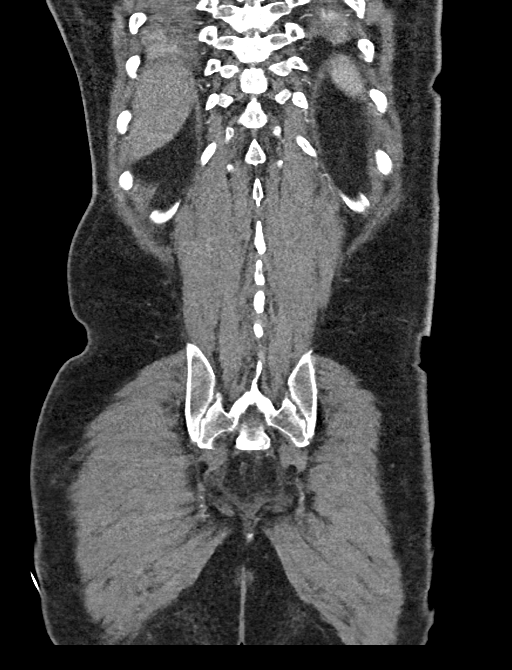

[14 of 46 positions shown; findings below may reference images not displayed]

FINDINGS: CTA CHEST FINDINGS

Cardiovascular: Contrast injection is sufficient to demonstrate
satisfactory opacification of the pulmonary arteries to the
segmental level. There is no pulmonary embolus or evidence of right
heart strain. The size of the main pulmonary artery is normal. Heart
size is normal. Coronary artery calcifications are noted. The course
and caliber of the aorta are normal. There is no atherosclerotic
calcification. Opacification decreased due to pulmonary arterial
phase contrast bolus timing.

Mediastinum/Nodes:

-- No mediastinal lymphadenopathy.

-- No hilar lymphadenopathy.

-- No axillary lymphadenopathy.

-- No supraclavicular lymphadenopathy.

-- Normal thyroid gland where visualized.

-  Unremarkable esophagus.

Lungs/Pleura: There is atelectasis at the lung bases. There are
trace bilateral pleural effusions. There is no pneumothorax.

Musculoskeletal: No chest wall abnormality. No bony spinal canal
stenosis.

Review of the MIP images confirms the above findings.

CT ABDOMEN and PELVIS FINDINGS

Hepatobiliary: The liver is normal. Normal gallbladder.There is no
biliary ductal dilation.

Pancreas: Normal contours without ductal dilatation. No
peripancreatic fluid collection.

Spleen: Unremarkable.

Adrenals/Urinary Tract:

--Adrenal glands: Unremarkable.

--Right kidney/ureter: No hydronephrosis or radiopaque kidney
stones.

--Left kidney/ureter: No hydronephrosis or radiopaque kidney stones.

--Urinary bladder: Unremarkable.

Stomach/Bowel:

--Stomach/Duodenum: No hiatal hernia or other gastric abnormality.
Normal duodenal course and caliber.

--Small bowel: Unremarkable.

--Colon: There is a large amount of stool in the colon.

--Appendix: Normal.

Vascular/Lymphatic: Atherosclerotic calcification is present within
the non-aneurysmal abdominal aorta, without hemodynamically
significant stenosis.

--No retroperitoneal lymphadenopathy.

--No mesenteric lymphadenopathy.

--No pelvic or inguinal lymphadenopathy.

Reproductive: The prostate gland is enlarged.

Other: There is a small amount of free fluid in the patient's pelvis
of unknown clinical significance. The abdominal wall is normal.

Musculoskeletal. No acute displaced fractures. There is
mild-to-moderate disc height loss at the L5-S1 level. The remaining
disc heights are relatively well preserved.

Review of the MIP images confirms the above findings.
IMPRESSION: 1. No evidence of pulmonary embolus.
2. Trace bilateral pleural effusions with atelectasis at the lung
bases.
3. No acute intra-abdominal or pelvic process.
4. No acute abnormality identified involving the lumbar spine.
5. Large amount of stool in the colon.
6. Prostatomegaly.
7. Small amount of free fluid in the patient's pelvis of unknown
clinical significance.

Aortic Atherosclerosis ([Q4]-[Q4]).

## 2020-07-28 MED ORDER — SIMVASTATIN 20 MG PO TABS
40.0000 mg | ORAL_TABLET | Freq: Every day | ORAL | Status: DC
  Administered 2020-07-28 – 2020-08-02 (×6): 40 mg via ORAL
  Filled 2020-07-28 (×6): qty 2

## 2020-07-28 MED ORDER — ADULT MULTIVITAMIN W/MINERALS CH
1.0000 | ORAL_TABLET | Freq: Every day | ORAL | Status: DC
  Administered 2020-07-28 – 2020-08-02 (×6): 1 via ORAL
  Filled 2020-07-28 (×5): qty 1

## 2020-07-28 MED ORDER — ACETAMINOPHEN 325 MG PO TABS
650.0000 mg | ORAL_TABLET | Freq: Once | ORAL | Status: AC
  Administered 2020-07-28: 650 mg via ORAL
  Filled 2020-07-28: qty 2

## 2020-07-28 MED ORDER — VANCOMYCIN HCL IN DEXTROSE 1-5 GM/200ML-% IV SOLN
1000.0000 mg | Freq: Two times a day (BID) | INTRAVENOUS | Status: DC
  Administered 2020-07-29: 1000 mg via INTRAVENOUS
  Filled 2020-07-28 (×2): qty 200

## 2020-07-28 MED ORDER — LEVOFLOXACIN IN D5W 750 MG/150ML IV SOLN
750.0000 mg | Freq: Once | INTRAVENOUS | Status: AC
  Administered 2020-07-28: 750 mg via INTRAVENOUS
  Filled 2020-07-28: qty 150

## 2020-07-28 MED ORDER — CALCIPOTRIENE 0.005 % EX CREA: TOPICAL_CREAM | Freq: Two times a day (BID) | CUTANEOUS | Status: DC

## 2020-07-28 MED ORDER — CALCIPOTRIENE-BETAMETH DIPROP 0.005-0.064 % EX SUSP: 1.0000 "application " | CUTANEOUS | Status: DC

## 2020-07-28 MED ORDER — ACETAMINOPHEN 650 MG RE SUPP: 650.0000 mg | Freq: Four times a day (QID) | RECTAL | Status: DC | PRN

## 2020-07-28 MED ORDER — SODIUM CHLORIDE 0.9 % IV BOLUS
1000.0000 mL | Freq: Once | INTRAVENOUS | Status: AC
  Administered 2020-07-28: 1000 mL via INTRAVENOUS

## 2020-07-28 MED ORDER — OMEGA-3-ACID ETHYL ESTERS 1 G PO CAPS
1.0000 g | ORAL_CAPSULE | Freq: Two times a day (BID) | ORAL | Status: DC
  Administered 2020-07-28 – 2020-08-02 (×10): 1 g via ORAL
  Filled 2020-07-28 (×11): qty 1

## 2020-07-28 MED ORDER — APPLE CIDER VINEGAR PLUS PO TABS: ORAL_TABLET | Freq: Every day | ORAL | Status: DC

## 2020-07-28 MED ORDER — FLUOXETINE HCL 20 MG PO CAPS
40.0000 mg | ORAL_CAPSULE | Freq: Every day | ORAL | Status: DC
  Administered 2020-07-29 – 2020-08-02 (×5): 40 mg via ORAL
  Filled 2020-07-28 (×5): qty 2
  Filled 2020-07-28: qty 4

## 2020-07-28 MED ORDER — VANCOMYCIN HCL 1500 MG/300ML IV SOLN
1500.0000 mg | Freq: Once | INTRAVENOUS | Status: AC
  Administered 2020-07-28: 1500 mg via INTRAVENOUS
  Filled 2020-07-28: qty 300

## 2020-07-28 MED ORDER — SODIUM CHLORIDE 0.9 % IV SOLN: INTRAVENOUS | Status: DC

## 2020-07-28 MED ORDER — ALBUTEROL SULFATE (2.5 MG/3ML) 0.083% IN NEBU: 2.5000 mg | INHALATION_SOLUTION | RESPIRATORY_TRACT | Status: DC | PRN

## 2020-07-28 MED ORDER — IPRATROPIUM BROMIDE 0.02 % IN SOLN
0.5000 mg | Freq: Four times a day (QID) | RESPIRATORY_TRACT | Status: DC
  Administered 2020-07-28: 0.5 mg via RESPIRATORY_TRACT
  Filled 2020-07-28 (×2): qty 2.5

## 2020-07-28 MED ORDER — PIPERACILLIN-TAZOBACTAM 3.375 G IVPB
3.3750 g | Freq: Three times a day (TID) | INTRAVENOUS | Status: DC
  Administered 2020-07-29 – 2020-07-31 (×7): 3.375 g via INTRAVENOUS
  Filled 2020-07-28 (×9): qty 50

## 2020-07-28 MED ORDER — IOHEXOL 350 MG/ML SOLN
100.0000 mL | Freq: Once | INTRAVENOUS | Status: AC | PRN
  Administered 2020-07-28: 100 mL via INTRAVENOUS

## 2020-07-28 MED ORDER — PIPERACILLIN-TAZOBACTAM 3.375 G IVPB 30 MIN
3.3750 g | Freq: Once | INTRAVENOUS | Status: AC
  Administered 2020-07-28: 3.375 g via INTRAVENOUS

## 2020-07-28 MED ORDER — ACETAMINOPHEN 325 MG PO TABS
650.0000 mg | ORAL_TABLET | Freq: Four times a day (QID) | ORAL | Status: DC | PRN
  Administered 2020-07-29 – 2020-08-02 (×7): 650 mg via ORAL
  Filled 2020-07-28 (×10): qty 2

## 2020-07-28 MED ORDER — VITAMIN E 45 MG (100 UNIT) PO CAPS
400.0000 [IU] | ORAL_CAPSULE | Freq: Every day | ORAL | Status: DC
  Administered 2020-07-28 – 2020-08-02 (×6): 400 [IU] via ORAL
  Filled 2020-07-28 (×6): qty 4

## 2020-07-28 MED ORDER — HEPARIN SODIUM (PORCINE) 5000 UNIT/ML IJ SOLN
5000.0000 [IU] | Freq: Three times a day (TID) | INTRAMUSCULAR | Status: DC
  Administered 2020-07-29 – 2020-08-01 (×11): 5000 [IU] via SUBCUTANEOUS
  Filled 2020-07-28 (×11): qty 1

## 2020-07-28 MED ORDER — HYDROCODONE-ACETAMINOPHEN 5-325 MG PO TABS
1.0000 | ORAL_TABLET | ORAL | Status: DC | PRN
  Administered 2020-07-28: 1 via ORAL
  Administered 2020-07-29: 2 via ORAL
  Administered 2020-07-29: 1 via ORAL
  Administered 2020-07-30 – 2020-07-31 (×4): 2 via ORAL
  Administered 2020-07-31: 1 via ORAL
  Administered 2020-08-01 – 2020-08-02 (×3): 2 via ORAL
  Filled 2020-07-28 (×5): qty 2
  Filled 2020-07-28: qty 1
  Filled 2020-07-28: qty 2
  Filled 2020-07-28: qty 1
  Filled 2020-07-28 (×2): qty 2
  Filled 2020-07-28: qty 1

## 2020-07-28 MED ORDER — PIPERACILLIN-TAZOBACTAM 3.375 G IVPB 30 MIN: 3.3750 g | Freq: Three times a day (TID) | INTRAVENOUS | Status: DC

## 2020-07-28 MED ORDER — GARLIC 1000 MG PO CAPS: 1000.0000 mg | ORAL_CAPSULE | Freq: Every day | ORAL | Status: DC

## 2020-07-28 NOTE — Procedures (Signed)
Echo attempted. Patient not in ED20 upon attempt. Will attempt again later.

## 2020-07-28 NOTE — ED Notes (Signed)
Pt transported to Xray. 

## 2020-07-28 NOTE — Progress Notes (Signed)
Pharmacy Antibiotic Note  Douglas Erickson is a 74 y.o. male admitted on 07/28/2020 with pneumonia.  Pharmacy has been consulted for vancomycin dosing.  Gram negative coverage per MD.    Plan: Vancomycin 1500 mg IV x 1, then 1000 mg IV every 12 hours Add MRSA PCR Monitor renal function, Cx/PCR and clinical progression to narrow Vancomycin trough at steady state  Height: 6' (182.9 cm) Weight: 78.5 kg (173 lb) IBW/kg (Calculated) : 77.6  Temp (24hrs), Avg:99.2 F (37.3 C), Min:99.2 F (37.3 C), Max:99.2 F (37.3 C)  Recent Labs  Lab 07/28/20 1114 07/28/20 1407  WBC 17.2*  --   CREATININE 1.14  --   LATICACIDVEN 1.8 2.4*    Estimated Creatinine Clearance: 65.2 mL/min (by C-G formula based on SCr of 1.14 mg/dL).    No Known Allergies  Bertis Ruddy, PharmD Clinical Pharmacist ED Pharmacist Phone # [phone] 07/28/2020 3:34 PM

## 2020-07-28 NOTE — ED Provider Notes (Signed)
[facility] EMERGENCY DEPARTMENT Provider Note   CSN: [id number] Arrival date & time: 07/28/20  1055     History Chief Complaint  Patient presents with  . Pneumonia    Douglas Erickson is a 74 y.o. male past medical history of diabetes, hypertension, hyperlipidemia, presenting with worsening symptoms after recent diagnosis of pneumonia.  He states his symptoms started last week.  On the 26th he began taking a Z-Pak prescribed by his PCP Dr. Manuella Ghazi, after calling in for fever.  He was evaluated at [facility] emergency department on 07/25/2020 where he was diagnosed with pneumonia.  The following day his wife called PCP back and he added on cefuroxime.  He states his symptoms have worsened, T-max of 102 F yesterday.  His wife states she has been giving him Motrin for fever and body aches, however he has been profusely sweating after the Motrin.  He states he began developing a cough last night though does not have associated shortness of breath or chest pain.  No abdominal complaints.  He states he began urinating a little bit more frequently since his ED visit though no dysuria.  He tested negative for Covid, in addition to negative respiratory panel by [facility] ED.  He is vaccinated against Covid.  The history is provided by the patient, medical records and the spouse.       Past Medical History:  Diagnosis Date  . Diabetes mellitus without complication ([place])   . High cholesterol   . Hypertension   . Kidney disease   . Pneumonia     Patient Active Problem List   Diagnosis Date Noted  . DM (diabetes mellitus) ([place]) 07/28/2020  . Benign essential HTN 07/28/2020  . Hyperlipidemia 07/28/2020  . Depression 07/28/2020    History reviewed. No pertinent surgical history.     History reviewed. No pertinent family history.  Social History   Tobacco Use  . Smoking status: Never Smoker  . Smokeless tobacco: Never Used  Substance Use Topics  . Alcohol use: Not  Currently  . Drug use: Never    Home Medications Prior to Admission medications   Medication Sig Start Date End Date Taking? Authorizing Provider  Apple Cid Vn-Grn Tea-Bit Or-Cr (APPLE CIDER VINEGAR PLUS PO) Take 10 mLs by mouth at bedtime. Mix with water   Yes [provider]  Ascorbic Acid (VITAMIN C) 1000 MG tablet Take 500 mg by mouth daily.   Yes [provider]  azithromycin (ZITHROMAX) 250 MG tablet Take 250-500 mg by mouth as directed. 07/26/20  Yes [provider]  calcipotriene (DOVONOX) 0.005 % cream Apply topically 2 (two) times daily.   Yes [provider]  calcipotriene-betamethasone (TACLONEX SCALP) external suspension Apply 1 application topically 2 (two) times a week.    Yes [provider]  cefUROXime (CEFTIN) 500 MG tablet Take 500 mg by mouth 2 (two) times daily. 07/26/20  Yes [provider]  FLUoxetine (PROZAC) 40 MG capsule Take 40 mg by mouth daily.   Yes [provider]  Garlic 4431 MG CAPS Take 1,000 mg by mouth daily.   Yes [provider]  hydrochlorothiazide (HYDRODIURIL) 25 MG tablet Take 25 mg by mouth daily.   Yes [provider]  ibuprofen (ADVIL) 200 MG tablet Take 200-400 mg by mouth every 6 (six) hours as needed for fever or moderate pain.   Yes [provider]  Multiple Vitamin (MULTIVITAMIN) capsule Take 1 capsule by mouth daily.   Yes [provider]  Omega-3 Fatty Acids (FISH OIL) 1200 MG CAPS Take 1,200 mg by mouth in the morning and at bedtime.   Yes [provider]  simvastatin (ZOCOR) 40 MG tablet Take 40 mg by mouth daily.   Yes [provider]  vitamin E (VITAMIN E) 180 MG (400 UNITS) capsule Take 400 Units by mouth daily.   Yes [provider]    Allergies    Patient has no known allergies.  Review of Systems   Review of Systems  Constitutional: Positive for chills, diaphoresis and fever.  Respiratory: Positive for  cough. Negative for shortness of breath.   Cardiovascular: Negative for chest pain.  Gastrointestinal: Negative for abdominal pain.  Genitourinary: Positive for frequency. Negative for dysuria.  All other systems reviewed and are negative.   Physical Exam Updated Vital Signs BP 137/80   Pulse 72   Temp 99.2 F (37.3 C) (Oral)   Resp (!) 21   Ht 6' (1.829 m)   Wt 78.5 kg   SpO2 96%   BMI 23.46 kg/m   Physical Exam Vitals and nursing note reviewed.  Constitutional:      General: He is not in acute distress.    Appearance: He is well-developed. He is ill-appearing.  HENT:     Head: Normocephalic and atraumatic.  Eyes:     Conjunctiva/sclera: Conjunctivae normal.  Cardiovascular:     Rate and Rhythm: Normal rate and regular rhythm.     Pulses: Normal pulses.  Pulmonary:     Effort: Pulmonary effort is normal. No respiratory distress.     Comments: Scattered rhonchi Abdominal:     General: Bowel sounds are normal.     Palpations: Abdomen is soft.     Tenderness: There is no abdominal tenderness.  Skin:    General: Skin is warm.  Neurological:     Mental Status: He is alert.  Psychiatric:        Behavior: Behavior normal.     ED Results / Procedures / Treatments   Labs (all labs ordered are listed, but only abnormal results are displayed) Labs Reviewed  LACTIC ACID, PLASMA - Abnormal; Notable for the following components:      Result Value   Lactic Acid, Venous 2.4 (*)    All other components within normal limits  COMPREHENSIVE METABOLIC PANEL - Abnormal; Notable for the following components:   Sodium 129 (*)    Potassium 3.4 (*)    Chloride 91 (*)    Glucose, Bld 256 (*)    BUN 28 (*)    Calcium 8.5 (*)    Total Protein 6.1 (*)    Albumin 2.3 (*)    AST 54 (*)    ALT 57 (*)    Alkaline Phosphatase 172 (*)    All other components within normal limits  CBC WITH DIFFERENTIAL/PLATELET - Abnormal; Notable for the following components:   WBC 17.2 (*)     Platelets 515 (*)    Neutro Abs 14.6 (*)    Abs Immature Granulocytes 0.42 (*)    All other components within normal limits  URINALYSIS, ROUTINE W REFLEX MICROSCOPIC - Abnormal; Notable for the following components:   APPearance HAZY (*)    Protein, ur 30 (*)    All other components within normal limits  D-DIMER, QUANTITATIVE (NOT AT [facility]) - Abnormal; Notable for the following components:   D-Dimer, Quant 4.84 (*)    All other components within normal limits  LACTATE DEHYDROGENASE - Abnormal; Notable for  the following components:   LDH 453 (*)    All other components within normal limits  FIBRINOGEN - Abnormal; Notable for the following components:   Fibrinogen >800 (*)    All other components within normal limits  RESPIRATORY PANEL BY RT PCR (FLU A&B, COVID)  CULTURE, BLOOD (ROUTINE X 2)  CULTURE, BLOOD (ROUTINE X 2)  LACTIC ACID, PLASMA  TRIGLYCERIDES  LEGIONELLA PNEUMOPHILA SEROGP 1 UR AG  PROCALCITONIN  FERRITIN  C-REACTIVE PROTEIN  LIPASE, BLOOD  TROPONIN I (HIGH SENSITIVITY)    EKG None  Radiology DG Chest 2 View  Result Date: 07/28/2020 CLINICAL DATA:  74 year old male with a history of pneumonia EXAM: CHEST - 2 VIEW COMPARISON:  07/25/2020 FINDINGS: Cardiomediastinal silhouette unchanged in size and contour. No pneumothorax. No pleural effusion. The vague opacity at the base of the left lung is favored to be persisting on the current plain film, slightly different appearance at the diaphragm given the varying angulation of the acquisition. No new confluent airspace disease. No interlobular septal thickening. No displaced fracture.  Degenerative changes of the spine. IMPRESSION: Similar appearance of the lungs to the prior plain film, with vague nodular opacity at the base of the left lung overlying the diaphragm, potentially atelectasis/scarring or small focus of pneumonia. Otherwise no new airspace disease or other acute changes. As was previously stated, follow-up PA  and lateral chest x-ray recommended in 4-6 weeks. Electronically Signed   By: Corrie Mckusick D.O.   On: 07/28/2020 11:46    Procedures Procedures (including critical care time)  Medications Ordered in ED Medications  levofloxacin (LEVAQUIN) IVPB 750 mg (750 mg Intravenous New Bag/Given 07/28/20 1343)  sodium chloride 0.9 % bolus 1,000 mL (1,000 mLs Intravenous New Bag/Given 07/28/20 1338)  acetaminophen (TYLENOL) tablet 650 mg (650 mg Oral Given 07/28/20 1344)    ED Course  I have reviewed the triage vital signs and the nursing notes.  Pertinent labs & imaging results that were available during my care of the patient were reviewed by me and considered in my medical decision making (see chart for details).    MDM Rules/Calculators/A&P                          Patient presenting for worsening symptoms related to pneumonia diagnosed on 07/25/2020.  He is temperature of 99.2 degrees Fahrenheit on arrival.  Lungs with scattered rhonchi, normal work of breathing and O2 saturation.  Blood work ordered.  Repeat chest x-ray.  Metabolic panel shows sodium of 129, similar to recent ED visit.  Potassium is 3.4, improved.  Creatinine is 1.14 with elevated BUN of 28.  LFTs are slightly elevated since 3 days ago.  White count is 17.2.  UA without signs of infection.  Lactic is 1.8.  Chest x-ray with persisting opacity at the base of the left lung.  He is given IV Levaquin given failed treatment with azithromycin and cefuroxime.  Tylenol for body aches, IV fluids for hydration and hyponatremia.  He will be admitted to the hospital service for further management.  Consulted with Dr. Posey Pronto, she requests CTA of the chest, additional blood work.  Patient and his wife are agreeable with plan for admission.   Final Clinical Impression(s) / ED Diagnoses Final diagnoses:  Back pain  Community acquired pneumonia of left lower lobe of lung    Rx / DC Orders ED Discharge Orders    None       Phoebe Marter,  [place] N,  PA-C 07/28/20 1457    Isla Pence, MD 07/28/20 1529

## 2020-07-28 NOTE — ED Notes (Signed)
Transport will bring pt back to room when exam is finished

## 2020-07-28 NOTE — H&P (Signed)
History and Physical    Douglas Erickson QMV:[id number] DOB: 16-Feb-1946 DOA: 07/28/2020  PCP: [facility] [phone]   Patient coming from: home   Chief Complaint:  Fever   HPI: Douglas Erickson is a 74 y.o. male with medical history significant of htn, DM II, coming to er for fever back pain diaphoresis.Pt has been having symptoms since october 17th. Went to church on Sunday and was having trouble breathing,  Went home and waited and then he had fever 99, and went to bed and thought it was virus. Since October 17th pt has had a fever every day but intermittently till today.the next day he cont to have a fever for one week then called family MD on oct 25th and was covid tested was negative and was given z pak. On z pak he continued to have a fever. Pt has a h/o urine issues.SHe called md again as fever continued na went to ER, [facility] [place], and did chest xray and pneumonia and d/c on zpak. Then pt has been home and he has continued to have rising fever. Pt reports fever/ fatigue / backpain x since fever oct 17th. Pt has a gall bladder.  ED Course:  Blood pressure 136/74, pulse 72, temperature 99.2 F (37.3 C), temperature source Oral, resp. rate (!) 28, height 6' (1.829 m), weight 78.5 kg, SpO2 96 %. SpO2: 96 % on RA. Labs show hyponatremia and elevated creatinine, elevated alk phosphatase with mild transaminases, cbc shows wbc count of 17.2, with normal hb and platelet count.   Review of Systems: As per HPI otherwise all systems reviewed and negative.  Past Medical History:  Diagnosis Date  . Diabetes mellitus without complication ([place])   . High cholesterol   . Hypertension   . Kidney disease   . Pneumonia     History reviewed. No pertinent surgical history.   reports that he has never smoked. He has never used smokeless tobacco. He reports previous alcohol use. He reports that he does not use drugs.  No Known Allergies  History reviewed. No  pertinent family history.  Prior to Admission medications   Medication Sig Start Date End Date Taking? Authorizing Provider  FLUoxetine (PROZAC) 40 MG capsule Take 40 mg by mouth daily.   Yes [provider]  hydrochlorothiazide (HYDRODIURIL) 25 MG tablet Take 25 mg by mouth daily.   Yes [provider]  simvastatin (ZOCOR) 40 MG tablet Take 40 mg by mouth daily.   Yes [provider]  calcipotriene (DOVONOX) 0.005 % cream Apply topically 2 (two) times daily.    [provider]  calcipotriene-betamethasone [facility] SCALP) external suspension Apply topically daily.    [provider]    Physical Exam: Vitals:   07/28/20 1251 07/28/20 1300 07/28/20 1400 07/28/20 1500  BP:  137/80 (!) 148/87 136/74  Pulse:  72 75 72  Resp:  (!) 21 (!) 24 (!) 28  Temp:      TempSrc:      SpO2:  96% 97% 96%  Weight: 78.5 kg     Height: 6' (1.829 m)       Constitutional: NAD, calm, comfortable Vitals:   07/28/20 1251 07/28/20 1300 07/28/20 1400 07/28/20 1500  BP:  137/80 (!) 148/87 136/74  Pulse:  72 75 72  Resp:  (!) 21 (!) 24 (!) 28  Temp:      TempSrc:      SpO2:  96% 97% 96%  Weight: 78.5 kg  Height: 6' (1.829 m)      Eyes: PERRL, EOMI lids and conjunctivae normal ENMT: Mucous membranes are moist. Posterior pharynx clear of any exudate or lesions.Normal dentition.  Neck: normal, supple, no masses, no thyromegaly, no carotid bruit  Respiratory: BL rales all throughout lung field. Cardiovascular: Regular rate and rhythm, no murmurs / rubs / gallops. No extremity edema. 2+ pedal pulses. No carotid bruits.  Abdomen: no tenderness, no masses palpated. No hepatosplenomegaly. Bowel sounds positive.  Musculoskeletal: no clubbing / cyanosis. No joint deformity upper and lower extremities. Pt moving all four ext , no contractures. Normal muscle tone.  Skin: no rashes, lesions, ulcers. No induration Neurologic: CN 2-12 grossly intact. Sensation intact,  DTR normal. Strength 5/5 in all 4.  Psychiatric: Normal judgment and insight. Alert and oriented x 3. Normal mood.   Labs on Admission: I have personally reviewed following labs and imaging studies  CBC: Recent Labs  Lab 07/28/20 1114  WBC 17.2*  NEUTROABS 14.6*  HGB 13.8  HCT 40.0  MCV 91.1  PLT 856*   Basic Metabolic Panel: Recent Labs  Lab 07/28/20 1114  NA 129*  K 3.4*  CL 91*  CO2 24  GLUCOSE 256*  BUN 28*  CREATININE 1.14  CALCIUM 8.5*   GFR: Estimated Creatinine Clearance: 65.2 mL/min (by C-G formula based on SCr of 1.14 mg/dL). Liver Function Tests: Recent Labs  Lab 07/28/20 1114  AST 54*  ALT 57*  ALKPHOS 172*  BILITOT 0.6  PROT 6.1*  ALBUMIN 2.3*   No results for input(s): LIPASE, AMYLASE in the last 168 hours. No results for input(s): AMMONIA in the last 168 hours. Coagulation Profile: No results for input(s): INR, PROTIME in the last 168 hours. Cardiac Enzymes: No results for input(s): CKTOTAL, CKMB, CKMBINDEX, TROPONINI in the last 168 hours. BNP (last 3 results) No results for input(s): PROBNP in the last 8760 hours. HbA1C: No results for input(s): HGBA1C in the last 72 hours. CBG: No results for input(s): GLUCAP in the last 168 hours. Lipid Profile: Recent Labs    07/28/20 1349  TRIG 90   Thyroid Function Tests: No results for input(s): TSH, T4TOTAL, FREET4, T3FREE, THYROIDAB in the last 72 hours. Anemia Panel: Recent Labs    07/28/20 1349  FERRITIN 1,031*   Urine analysis:    Component Value Date/Time   COLORURINE YELLOW 07/28/2020 1247   APPEARANCEUR HAZY (A) 07/28/2020 1247   LABSPEC 1.024 07/28/2020 1247   PHURINE 5.0 07/28/2020 1247   GLUCOSEU NEGATIVE 07/28/2020 1247   HGBUR NEGATIVE 07/28/2020 1247   [place] 07/28/2020 [place] 07/28/2020 1247   PROTEINUR 30 (A) 07/28/2020 1247   NITRITE NEGATIVE 07/28/2020 1247   LEUKOCYTESUR NEGATIVE 07/28/2020 1247    Intake/Output Summary (Last  24 hours) at 07/28/2020 1528 Last data filed at 07/28/2020 1515 Gross per 24 hour  Intake 1750 ml  Output --  Net 1750 ml   Lab Results  Component Value Date   CREATININE 1.14 07/28/2020    COVID-19 Labs  Recent Labs    07/28/20 1349  DDIMER 4.84*  FERRITIN 1,031*  LDH 453*  CRP 28.1*    Lab Results  Component Value Date   SARSCOV2NAA NEGATIVE 07/28/2020    Radiological Exams on Admission: DG Chest 2 View  Result Date: 07/28/2020 CLINICAL DATA:  74 year old male with a history of pneumonia EXAM: CHEST - 2 VIEW COMPARISON:  07/25/2020 FINDINGS: Cardiomediastinal silhouette unchanged in size and contour. No pneumothorax. No pleural effusion. The  vague opacity at the base of the left lung is favored to be persisting on the current plain film, slightly different appearance at the diaphragm given the varying angulation of the acquisition. No new confluent airspace disease. No interlobular septal thickening. No displaced fracture.  Degenerative changes of the spine. IMPRESSION: Similar appearance of the lungs to the prior plain film, with vague nodular opacity at the base of the left lung overlying the diaphragm, potentially atelectasis/scarring or small focus of pneumonia. Otherwise no new airspace disease or other acute changes. As was previously stated, follow-up PA and lateral chest x-ray recommended in 4-6 weeks. Electronically Signed   By: Corrie Mckusick D.O.   On: 07/28/2020 11:46    EKG: Independently reviewed.    Assessment/Plan Patient is a pleasant alert awake and oriented 74 year old Caucasian male being admitted to medical telemetry bed for fever and diaphoresis that has been going on since October 17.  Fevers: -Patient was seen at [facility] on Wednesday and discharged with a diagnosis of pneumonia and a Z-Pak.  Patient continued to have fevers on antibiotic therapy. -CT chest is pending for further detail and we will tailor therapy. -Blood cultures obtained in  the emergency room.-Empiric antibiotics continued. Blood cultures in this patient with persistent fever intermittent; "Pneumonia, biliary issue, ischemic bowel issue, we will cycle patient's troponins and evaluate.  2D echo has been ordered and pending DM (diabetes mellitus) ([place]) -Sliding scale insulin, A1c, hypoglycemia protocol. -Per patient report patient is not on any medications for his diabetes it is diet managed.  Benign essential HTN -Home regimen of HCTZ 25 is held. -Low-sodium diet. Hyperlipidemia -We will continue patient on his Zocor.  Depression -We will continue him on his Prozac per home regimen.    DVT prophylaxis:  Heparin   Code Status:  Full Code   Family Communication:  Wife margaret    Disposition Plan:  Home   Consults called:  None  Admission status: Inpatient   Para Skeans MD [facility] Pager [phone] If 7PM-7AM, please contact night-coverage [email or web address] Password [email or web address] 07/28/2020, 3:28 PM

## 2020-07-28 NOTE — ED Triage Notes (Signed)
Pt discharged from [facility] on Wednesday with pneumonia.  Wife reports pt has continued fever and clothes are saturated in the morning when he wakes up.  Hasn't taken anything for fever since last night.  Denies SOB.

## 2020-07-28 NOTE — ED Notes (Signed)
Pt back from X-ray.  

## 2020-07-29 ENCOUNTER — Other Ambulatory Visit (HOSPITAL_COMMUNITY): Payer: Medicare Other

## 2020-07-29 ENCOUNTER — Inpatient Hospital Stay (HOSPITAL_COMMUNITY): Payer: Medicare Other

## 2020-07-29 DIAGNOSIS — J189 Pneumonia, unspecified organism: Principal | ICD-10-CM

## 2020-07-29 DIAGNOSIS — I340 Nonrheumatic mitral (valve) insufficiency: Secondary | ICD-10-CM | POA: Diagnosis not present

## 2020-07-29 DIAGNOSIS — R509 Fever, unspecified: Secondary | ICD-10-CM | POA: Diagnosis not present

## 2020-07-29 LAB — CBC WITH DIFFERENTIAL/PLATELET
Abs Immature Granulocytes: 0.38 10*3/uL — ABNORMAL HIGH (ref 0.00–0.07)
Basophils Absolute: 0.1 10*3/uL (ref 0.0–0.1)
Basophils Relative: 0 %
Eosinophils Absolute: 0.5 10*3/uL (ref 0.0–0.5)
Eosinophils Relative: 3 %
HCT: 40.4 % (ref 39.0–52.0)
Hemoglobin: 13.8 g/dL (ref 13.0–17.0)
Immature Granulocytes: 2 %
Lymphocytes Relative: 10 %
Lymphs Abs: 1.7 10*3/uL (ref 0.7–4.0)
MCH: 31.4 pg (ref 26.0–34.0)
MCHC: 34.2 g/dL (ref 30.0–36.0)
MCV: 92 fL (ref 80.0–100.0)
Monocytes Absolute: 1.3 10*3/uL — ABNORMAL HIGH (ref 0.1–1.0)
Monocytes Relative: 7 %
Neutro Abs: 12.9 10*3/uL — ABNORMAL HIGH (ref 1.7–7.7)
Neutrophils Relative %: 78 %
Platelets: 526 10*3/uL — ABNORMAL HIGH (ref 150–400)
RBC: 4.39 MIL/uL (ref 4.22–5.81)
RDW: 12.3 % (ref 11.5–15.5)
WBC: 16.8 10*3/uL — ABNORMAL HIGH (ref 4.0–10.5)
nRBC: 0 % (ref 0.0–0.2)

## 2020-07-29 LAB — COMPREHENSIVE METABOLIC PANEL
ALT: 52 U/L — ABNORMAL HIGH (ref 0–44)
AST: 47 U/L — ABNORMAL HIGH (ref 15–41)
Albumin: 2.2 g/dL — ABNORMAL LOW (ref 3.5–5.0)
Alkaline Phosphatase: 154 U/L — ABNORMAL HIGH (ref 38–126)
Anion gap: 15 (ref 5–15)
BUN: 24 mg/dL — ABNORMAL HIGH (ref 8–23)
CO2: 23 mmol/L (ref 22–32)
Calcium: 8.7 mg/dL — ABNORMAL LOW (ref 8.9–10.3)
Chloride: 96 mmol/L — ABNORMAL LOW (ref 98–111)
Creatinine, Ser: 1.36 mg/dL — ABNORMAL HIGH (ref 0.61–1.24)
GFR, Estimated: 56 mL/min — ABNORMAL LOW (ref >60)
Glucose, Bld: 153 mg/dL — ABNORMAL HIGH (ref 70–99)
Potassium: 3.2 mmol/L — ABNORMAL LOW (ref 3.5–5.1)
Sodium: 134 mmol/L — ABNORMAL LOW (ref 135–145)
Total Bilirubin: 0.6 mg/dL (ref 0.3–1.2)
Total Protein: 6.3 g/dL — ABNORMAL LOW (ref 6.5–8.1)

## 2020-07-29 LAB — ECHOCARDIOGRAM COMPLETE
Area-P 1/2: 3.17 cm2
Height: 72 in
S' Lateral: 2.2 cm
Weight: 2768.98 oz

## 2020-07-29 LAB — PHOSPHORUS: Phosphorus: 3.7 mg/dL (ref 2.5–4.6)

## 2020-07-29 LAB — MAGNESIUM: Magnesium: 1.8 mg/dL (ref 1.7–2.4)

## 2020-07-29 MED ORDER — POTASSIUM CHLORIDE CRYS ER 20 MEQ PO TBCR
40.0000 meq | EXTENDED_RELEASE_TABLET | Freq: Every day | ORAL | Status: DC
  Administered 2020-07-29 – 2020-08-02 (×5): 40 meq via ORAL
  Filled 2020-07-29 (×5): qty 2

## 2020-07-29 MED ORDER — IPRATROPIUM BROMIDE 0.02 % IN SOLN: 0.5000 mg | Freq: Four times a day (QID) | RESPIRATORY_TRACT | Status: DC | PRN

## 2020-07-29 NOTE — Progress Notes (Signed)
*  PRELIMINARY RESULTS* Echocardiogram 2D Echocardiogram has been performed.  Douglas Erickson 07/29/2020, 2:11 PM

## 2020-07-29 NOTE — Progress Notes (Signed)
Patient needing to get out of bed to use urinal.  Patient had a fall at home earlier in the week prior to this admission.  He also complained of his legs being weak when arriving on the unit.  Attempted to educate on the importance of calling for help when getting out of bed.  Also, advised that we would put the bed alarm to remind him.  He stated that the bed alarm was more of an aggravation than a help.  He is A & O x 4.  He states he does not want the bed alarm on.  Earleen Reaper RN

## 2020-07-29 NOTE — Progress Notes (Signed)
New Admission Note:  Arrival Method: By stretcher from ED around 0500 Mental Orientation: Alert and oriented Telemetry: Box 13, CCMD notified Assessment: Completed Skin: Completed, refer to flowsheets IV: Left forearm S.L. Pain: 4/10 back/legs pt states had Tylenol Tubes: None Safety Measures: Safety Fall Prevention Plan was given, discussed and signed. Admission: Completed [facility] Orientation: Patient has been orientated to the room, unit and the staff. Family: None  Orders have been reviewed and implemented. Will continue to monitor the patient. Call light has been placed within reach and bed alarm has been activated.   Perry Mount, RN  Phone Number: [phone]

## 2020-07-29 NOTE — Progress Notes (Addendum)
PROGRESS NOTE    Douglas Erickson  DQQ:[id number] DOB: 1946-04-22 DOA: 07/28/2020 PCP: Patient, No Pcp Per  Brief Narrative:  74 year old white male known history of hypertension depression DM TY 2 Recent SOP, fever since 1017 went to [facility] ED given a Z-Pak Covid negative Return to emergency room with rising fever T-max 99.2 blood pressure 130/70 Found to be hyponatremic sodium 129 with possible AKI BUN/creatinine 20/1.1 mild transaminitis AST ALT 54/57 alk phos 172 lactic acid 2.4  Assessment & Plan:   Active Problems:   DM (diabetes mellitus) ([place])   Benign essential HTN   Hyperlipidemia   Depression   Fever   1. Pneumonia failing outpatient therapy a. Overall feeling a little better b. Continue vancomycin Zosyn and probably narrow to cefepime doxycycline in the next 24 hours if no fever spike and then hopefully to oral medication c. CT chest does not confirm PE or any other lumbar pathology d. Follow results of blood cult 2. AKI on admission a. Creatinine slightly worse therefore increase fluid rate to 125 cc an hour 3. Hyponatremia,-hypokalemia a. Replace with K. Dur 40 and reevaluate, check magnesium in a.m. 4. Transaminitis a. Slightly elevated-slight elevation of alk phos in addition b. Will need outpatient screening and hepatitis panel but I expect this will resolve when to recheck given outpatient 5. Depression a. Continue Prozac 40 daily 6. HTN a. Holding at this time HCTZ given mild renal insufficiency  DVT prophylaxis: Lovenox Code Status: Presumed full Family Communication: None  Disposition:   Status is: Inpatient  Remains inpatient appropriate because:Persistent severe electrolyte disturbances, Ongoing active pain requiring inpatient pain management and Unsafe d/c plan   Dispo: The patient is from: Home              Anticipated d/c is to: Home              Anticipated d/c date is: 3 days              Patient currently is not medically stable to  d/c.       Consultants:   None  Procedures: No  Antimicrobials: Vancomycin Zosyn   Subjective: Awake alert no distress feels weak but had a full breakfast concerned about results of CT scan Explained the same No chest pain no fever not using oxygen  Objective: Vitals:   07/29/20 0436 07/29/20 0436 07/29/20 0456 07/29/20 0500  BP:  (!) 141/74 (!) 147/79   Pulse: 72  73   Resp: 20  18   Temp:   97.8 F (36.6 C)   TempSrc:   Oral   SpO2: 95%  93%   Weight:    78.5 kg  Height:        Intake/Output Summary (Last 24 hours) at 07/29/2020 0734 Last data filed at 07/29/2020 1941 Gross per 24 hour  Intake 3262.75 ml  Output 250 ml  Net 3012.75 ml   Filed Weights   07/28/20 1251 07/29/20 0500  Weight: 78.5 kg 78.5 kg    Examination:  General exam: EOMI NCAT no focal deficit throat soft supple Respiratory system: Clinically clear no added sound Cardiovascular system: S1-S2 no murmur no rub no gallop telemetry benign Gastrointestinal system: Abdomen soft nontender no rebound. Central nervous system: Moving all 4 limbs equally power 5/5 grossly rest deferred Extremities: ROM intact joints move synchronously Skin: No lower extremity edema Psychiatry: Euthymic but slightly flat  Data Reviewed: I have personally reviewed following labs and imaging studies  Sodium 134 potassium 3.2  BUNs/creatinine up to 24/1.3 Alk phos 154 AST/ALT 47/52 White count 16.8 hemoglobin 13 platelet 526  Radiology Studies: DG Chest 2 View  Result Date: 07/28/2020 CLINICAL DATA:  74 year old male with a history of pneumonia EXAM: CHEST - 2 VIEW COMPARISON:  07/25/2020 FINDINGS: Cardiomediastinal silhouette unchanged in size and contour. No pneumothorax. No pleural effusion. The vague opacity at the base of the left lung is favored to be persisting on the current plain film, slightly different appearance at the diaphragm given the varying angulation of the acquisition. No new confluent  airspace disease. No interlobular septal thickening. No displaced fracture.  Degenerative changes of the spine. IMPRESSION: Similar appearance of the lungs to the prior plain film, with vague nodular opacity at the base of the left lung overlying the diaphragm, potentially atelectasis/scarring or small focus of pneumonia. Otherwise no new airspace disease or other acute changes. As was previously stated, follow-up PA and lateral chest x-ray recommended in 4-6 weeks. Electronically Signed   By: Corrie Mckusick D.O.   On: 07/28/2020 11:46   CT Angio Chest PE W/Cm &/Or Wo Cm  Result Date: 07/28/2020 CLINICAL DATA:  Sepsis. Possible GI source. Back pain and fever with diaphoresis. EXAM: CT ANGIOGRAPHY CHEST CT ANGIOGRAPHY ABDOMEN AND PELVIS WITH CONTRAST CT LUMBAR SPINE WITHOUT CONTRAST TECHNIQUE: Multidetector CT imaging of the chest was performed using the standard protocol during bolus administration of intravenous contrast. Multiplanar CT image reconstructions and MIPs were obtained to evaluate the vascular anatomy. Multidetector CT imaging of the abdomen and pelvis was performed using the standard protocol during bolus administration of intravenous contrast. Multiplanar CT images of the lumbar spine was reconstructed from contemporary CT of the Chest, Abdomen, and Pelvis CONTRAST:  174m OMNIPAQUE IOHEXOL 350 MG/ML SOLN COMPARISON:  None. FINDINGS: CTA CHEST FINDINGS Cardiovascular: Contrast injection is sufficient to demonstrate satisfactory opacification of the pulmonary arteries to the segmental level. There is no pulmonary embolus or evidence of right heart strain. The size of the main pulmonary artery is normal. Heart size is normal. Coronary artery calcifications are noted. The course and caliber of the aorta are normal. There is no atherosclerotic calcification. Opacification decreased due to pulmonary arterial phase contrast bolus timing. Mediastinum/Nodes: -- No mediastinal lymphadenopathy. -- No hilar  lymphadenopathy. -- No axillary lymphadenopathy. -- No supraclavicular lymphadenopathy. -- Normal thyroid gland where visualized. -  Unremarkable esophagus. Lungs/Pleura: There is atelectasis at the lung bases. There are trace bilateral pleural effusions. There is no pneumothorax. Musculoskeletal: No chest wall abnormality. No bony spinal canal stenosis. Review of the MIP images confirms the above findings. CT ABDOMEN and PELVIS FINDINGS Hepatobiliary: The liver is normal. Normal gallbladder.There is no biliary ductal dilation. Pancreas: Normal contours without ductal dilatation. No peripancreatic fluid collection. Spleen: Unremarkable. Adrenals/Urinary Tract: --Adrenal glands: Unremarkable. --Right kidney/ureter: No hydronephrosis or radiopaque kidney stones. --Left kidney/ureter: No hydronephrosis or radiopaque kidney stones. --Urinary bladder: Unremarkable. Stomach/Bowel: --Stomach/Duodenum: No hiatal hernia or other gastric abnormality. Normal duodenal course and caliber. --Small bowel: Unremarkable. --Colon: There is a large amount of stool in the colon. --Appendix: Normal. Vascular/Lymphatic: Atherosclerotic calcification is present within the non-aneurysmal abdominal aorta, without hemodynamically significant stenosis. --No retroperitoneal lymphadenopathy. --No mesenteric lymphadenopathy. --No pelvic or inguinal lymphadenopathy. Reproductive: The prostate gland is enlarged. Other: There is a small amount of free fluid in the patient's pelvis of unknown clinical significance. The abdominal wall is normal. Musculoskeletal. No acute displaced fractures. There is mild-to-moderate disc height loss at the L5-S1 level. The remaining disc heights are relatively well  preserved. Review of the MIP images confirms the above findings. IMPRESSION: 1. No evidence of pulmonary embolus. 2. Trace bilateral pleural effusions with atelectasis at the lung bases. 3. No acute intra-abdominal or pelvic process. 4. No acute  abnormality identified involving the lumbar spine. 5. Large amount of stool in the colon. 6. Prostatomegaly. 7. Small amount of free fluid in the patient's pelvis of unknown clinical significance. Aortic Atherosclerosis (ICD10-I70.0). Electronically Signed   By: Constance Holster M.D.   On: 07/28/2020 16:24   CT L-SPINE NO CHARGE  Result Date: 07/28/2020 CLINICAL DATA:  Sepsis. Possible GI source. Back pain and fever with diaphoresis. EXAM: CT ANGIOGRAPHY CHEST CT ANGIOGRAPHY ABDOMEN AND PELVIS WITH CONTRAST CT LUMBAR SPINE WITHOUT CONTRAST TECHNIQUE: Multidetector CT imaging of the chest was performed using the standard protocol during bolus administration of intravenous contrast. Multiplanar CT image reconstructions and MIPs were obtained to evaluate the vascular anatomy. Multidetector CT imaging of the abdomen and pelvis was performed using the standard protocol during bolus administration of intravenous contrast. Multiplanar CT images of the lumbar spine was reconstructed from contemporary CT of the Chest, Abdomen, and Pelvis CONTRAST:  141m OMNIPAQUE IOHEXOL 350 MG/ML SOLN COMPARISON:  None. FINDINGS: CTA CHEST FINDINGS Cardiovascular: Contrast injection is sufficient to demonstrate satisfactory opacification of the pulmonary arteries to the segmental level. There is no pulmonary embolus or evidence of right heart strain. The size of the main pulmonary artery is normal. Heart size is normal. Coronary artery calcifications are noted. The course and caliber of the aorta are normal. There is no atherosclerotic calcification. Opacification decreased due to pulmonary arterial phase contrast bolus timing. Mediastinum/Nodes: -- No mediastinal lymphadenopathy. -- No hilar lymphadenopathy. -- No axillary lymphadenopathy. -- No supraclavicular lymphadenopathy. -- Normal thyroid gland where visualized. -  Unremarkable esophagus. Lungs/Pleura: There is atelectasis at the lung bases. There are trace bilateral pleural  effusions. There is no pneumothorax. Musculoskeletal: No chest wall abnormality. No bony spinal canal stenosis. Review of the MIP images confirms the above findings. CT ABDOMEN and PELVIS FINDINGS Hepatobiliary: The liver is normal. Normal gallbladder.There is no biliary ductal dilation. Pancreas: Normal contours without ductal dilatation. No peripancreatic fluid collection. Spleen: Unremarkable. Adrenals/Urinary Tract: --Adrenal glands: Unremarkable. --Right kidney/ureter: No hydronephrosis or radiopaque kidney stones. --Left kidney/ureter: No hydronephrosis or radiopaque kidney stones. --Urinary bladder: Unremarkable. Stomach/Bowel: --Stomach/Duodenum: No hiatal hernia or other gastric abnormality. Normal duodenal course and caliber. --Small bowel: Unremarkable. --Colon: There is a large amount of stool in the colon. --Appendix: Normal. Vascular/Lymphatic: Atherosclerotic calcification is present within the non-aneurysmal abdominal aorta, without hemodynamically significant stenosis. --No retroperitoneal lymphadenopathy. --No mesenteric lymphadenopathy. --No pelvic or inguinal lymphadenopathy. Reproductive: The prostate gland is enlarged. Other: There is a small amount of free fluid in the patient's pelvis of unknown clinical significance. The abdominal wall is normal. Musculoskeletal. No acute displaced fractures. There is mild-to-moderate disc height loss at the L5-S1 level. The remaining disc heights are relatively well preserved. Review of the MIP images confirms the above findings. IMPRESSION: 1. No evidence of pulmonary embolus. 2. Trace bilateral pleural effusions with atelectasis at the lung bases. 3. No acute intra-abdominal or pelvic process. 4. No acute abnormality identified involving the lumbar spine. 5. Large amount of stool in the colon. 6. Prostatomegaly. 7. Small amount of free fluid in the patient's pelvis of unknown clinical significance. Aortic Atherosclerosis (ICD10-I70.0). Electronically  Signed   By: CConstance HolsterM.D.   On: 07/28/2020 16:24   CT Angio Abd/Pel w/ and/or w/o  Result Date:  07/28/2020 CLINICAL DATA:  Sepsis. Possible GI source. Back pain and fever with diaphoresis. EXAM: CT ANGIOGRAPHY CHEST CT ANGIOGRAPHY ABDOMEN AND PELVIS WITH CONTRAST CT LUMBAR SPINE WITHOUT CONTRAST TECHNIQUE: Multidetector CT imaging of the chest was performed using the standard protocol during bolus administration of intravenous contrast. Multiplanar CT image reconstructions and MIPs were obtained to evaluate the vascular anatomy. Multidetector CT imaging of the abdomen and pelvis was performed using the standard protocol during bolus administration of intravenous contrast. Multiplanar CT images of the lumbar spine was reconstructed from contemporary CT of the Chest, Abdomen, and Pelvis CONTRAST:  15m OMNIPAQUE IOHEXOL 350 MG/ML SOLN COMPARISON:  None. FINDINGS: CTA CHEST FINDINGS Cardiovascular: Contrast injection is sufficient to demonstrate satisfactory opacification of the pulmonary arteries to the segmental level. There is no pulmonary embolus or evidence of right heart strain. The size of the main pulmonary artery is normal. Heart size is normal. Coronary artery calcifications are noted. The course and caliber of the aorta are normal. There is no atherosclerotic calcification. Opacification decreased due to pulmonary arterial phase contrast bolus timing. Mediastinum/Nodes: -- No mediastinal lymphadenopathy. -- No hilar lymphadenopathy. -- No axillary lymphadenopathy. -- No supraclavicular lymphadenopathy. -- Normal thyroid gland where visualized. -  Unremarkable esophagus. Lungs/Pleura: There is atelectasis at the lung bases. There are trace bilateral pleural effusions. There is no pneumothorax. Musculoskeletal: No chest wall abnormality. No bony spinal canal stenosis. Review of the MIP images confirms the above findings. CT ABDOMEN and PELVIS FINDINGS Hepatobiliary: The liver is normal. Normal  gallbladder.There is no biliary ductal dilation. Pancreas: Normal contours without ductal dilatation. No peripancreatic fluid collection. Spleen: Unremarkable. Adrenals/Urinary Tract: --Adrenal glands: Unremarkable. --Right kidney/ureter: No hydronephrosis or radiopaque kidney stones. --Left kidney/ureter: No hydronephrosis or radiopaque kidney stones. --Urinary bladder: Unremarkable. Stomach/Bowel: --Stomach/Duodenum: No hiatal hernia or other gastric abnormality. Normal duodenal course and caliber. --Small bowel: Unremarkable. --Colon: There is a large amount of stool in the colon. --Appendix: Normal. Vascular/Lymphatic: Atherosclerotic calcification is present within the non-aneurysmal abdominal aorta, without hemodynamically significant stenosis. --No retroperitoneal lymphadenopathy. --No mesenteric lymphadenopathy. --No pelvic or inguinal lymphadenopathy. Reproductive: The prostate gland is enlarged. Other: There is a small amount of free fluid in the patient's pelvis of unknown clinical significance. The abdominal wall is normal. Musculoskeletal. No acute displaced fractures. There is mild-to-moderate disc height loss at the L5-S1 level. The remaining disc heights are relatively well preserved. Review of the MIP images confirms the above findings. IMPRESSION: 1. No evidence of pulmonary embolus. 2. Trace bilateral pleural effusions with atelectasis at the lung bases. 3. No acute intra-abdominal or pelvic process. 4. No acute abnormality identified involving the lumbar spine. 5. Large amount of stool in the colon. 6. Prostatomegaly. 7. Small amount of free fluid in the patient's pelvis of unknown clinical significance. Aortic Atherosclerosis (ICD10-I70.0). Electronically Signed   By: CConstance HolsterM.D.   On: 07/28/2020 16:24     Scheduled Meds: . calcipotriene   Topical BID  . [START ON 07/30/2020] calcipotriene-betamethasone  1 application Topical Once per day on Mon Thu  . FLUoxetine  40 mg Oral  Daily  . heparin  5,000 Units Subcutaneous Q8H  . multivitamin with minerals  1 tablet Oral Daily  . omega-3 acid ethyl esters  1 g Oral BID  . simvastatin  40 mg Oral Daily  . vitamin E  400 Units Oral Daily   Continuous Infusions: . sodium chloride 100 mL/hr at 07/29/20 0351  . piperacillin-tazobactam (ZOSYN)  IV 3.375 g (07/29/20 0516)  .  vancomycin Stopped (07/29/20 0559)     LOS: 1 day    Time spent: [place], MD [facility] To contact the attending provider between 7A-7P or the covering provider during after hours 7P-7A, please log into the web site [email or web address] and access using universal [place] password for that web site. If you do not have the password, please call the hospital operator.  07/29/2020, 7:34 AM

## 2020-07-30 DIAGNOSIS — R509 Fever, unspecified: Secondary | ICD-10-CM

## 2020-07-30 LAB — CBC WITH DIFFERENTIAL/PLATELET
Abs Immature Granulocytes: 0.27 10*3/uL — ABNORMAL HIGH (ref 0.00–0.07)
Basophils Absolute: 0 10*3/uL (ref 0.0–0.1)
Basophils Relative: 0 %
Eosinophils Absolute: 0.6 10*3/uL — ABNORMAL HIGH (ref 0.0–0.5)
Eosinophils Relative: 5 %
HCT: 34.9 % — ABNORMAL LOW (ref 39.0–52.0)
Hemoglobin: 11.6 g/dL — ABNORMAL LOW (ref 13.0–17.0)
Immature Granulocytes: 2 %
Lymphocytes Relative: 10 %
Lymphs Abs: 1.3 10*3/uL (ref 0.7–4.0)
MCH: 30.9 pg (ref 26.0–34.0)
MCHC: 33.2 g/dL (ref 30.0–36.0)
MCV: 93.1 fL (ref 80.0–100.0)
Monocytes Absolute: 0.8 10*3/uL (ref 0.1–1.0)
Monocytes Relative: 6 %
Neutro Abs: 10.2 10*3/uL — ABNORMAL HIGH (ref 1.7–7.7)
Neutrophils Relative %: 77 %
Platelets: 417 10*3/uL — ABNORMAL HIGH (ref 150–400)
RBC: 3.75 MIL/uL — ABNORMAL LOW (ref 4.22–5.81)
RDW: 12.5 % (ref 11.5–15.5)
WBC: 13.3 10*3/uL — ABNORMAL HIGH (ref 4.0–10.5)
nRBC: 0 % (ref 0.0–0.2)

## 2020-07-30 LAB — COMPREHENSIVE METABOLIC PANEL
ALT: 47 U/L — ABNORMAL HIGH (ref 0–44)
AST: 46 U/L — ABNORMAL HIGH (ref 15–41)
Albumin: 1.9 g/dL — ABNORMAL LOW (ref 3.5–5.0)
Alkaline Phosphatase: 140 U/L — ABNORMAL HIGH (ref 38–126)
Anion gap: 10 (ref 5–15)
BUN: 24 mg/dL — ABNORMAL HIGH (ref 8–23)
CO2: 23 mmol/L (ref 22–32)
Calcium: 8.1 mg/dL — ABNORMAL LOW (ref 8.9–10.3)
Chloride: 103 mmol/L (ref 98–111)
Creatinine, Ser: 1.35 mg/dL — ABNORMAL HIGH (ref 0.61–1.24)
GFR, Estimated: 56 mL/min — ABNORMAL LOW (ref >60)
Glucose, Bld: 136 mg/dL — ABNORMAL HIGH (ref 70–99)
Potassium: 3.8 mmol/L (ref 3.5–5.1)
Sodium: 136 mmol/L (ref 135–145)
Total Bilirubin: 0.8 mg/dL (ref 0.3–1.2)
Total Protein: 5.2 g/dL — ABNORMAL LOW (ref 6.5–8.1)

## 2020-07-30 LAB — MAGNESIUM: Magnesium: 1.8 mg/dL (ref 1.7–2.4)

## 2020-07-30 LAB — STREP PNEUMONIAE URINARY ANTIGEN: Strep Pneumo Urinary Antigen: NEGATIVE

## 2020-07-30 NOTE — Progress Notes (Signed)
PROGRESS NOTE   Douglas Erickson  FVC:[id number]    DOB: 04/16/1946    DOA: 07/28/2020  PCP: Patient, No Pcp Per   I have briefly reviewed patients previous medical records in [facility].  Chief Complaint  Patient presents with  . Pneumonia    Brief Narrative:  74 year old male with PMH of HTN, DM 2, HLD, presented to ED due to fever, back pain and diaphoresis.  He has been symptomatic since 07/15/2020.  He was diagnosed with pneumonia at the [facility] on 10/27 and discharged on azithromycin but continued to have fevers and not eating or drinking much.  Admitted for pneumonia that failed outpatient antibiotic.  Assessment & Plan:  Active Problems:   DM (diabetes mellitus) ([place])   Benign essential HTN   Hyperlipidemia   Depression   Fever   Community acquired pneumonia of left lower lobe of lung   Fever, unclear etiology Initially admitted as suspected pneumonia that failed outpatient treatment.  However CTA chest on admission showed no evidence of PE and no comment made about consolidation or pneumonia.  Had trace bilateral pleural effusions with atelectasis at the lung bases.  Also no acute intra-abdominal or pelvic process or lumbar spine on CT abdomen.  Started empirically on IV Zosyn, vancomycin discontinued.  Patient reports feeling better and seems to have defervesced.  Leukocytosis improving, no CBC drawn today, will recheck in a.m.  MRSA PCR negative.  RSV, flu, COVID-19 PCR negative.  Urine microscopy not suggestive of UTI.  Blood cultures x2: Negative to date.  Consider narrowing antibiotic spectrum in a.m.  Acute kidney injury Creatinine 1.14 on admission which went up to 1.36 but stable in that range over the last 24 hours.  May be multifactorial due to dehydration on admission, infection and also got contrast.  Follow BMP in a.m. and avoid further nephrotoxic's.  Hypokalemia Replaced.  Magnesium normal.  Dehydration with hyponatremia:  Better.  Hyponatremia  resolved.  Cut back on IV fluids.  Patient tolerating p.o. intake.  Anemia Possibly dilutional.  Also has neutrophilic leukocytosis.  Mild LFT abnormalities Stable.  Unclear etiology.  Depression Continue Prozac.  Essential hypertension Controlled.  HCTZ on hold.   DVT prophylaxis: heparin injection 5,000 Units Start: 07/28/20 2200 SCDs Start: 07/28/20 1836     Code Status: Full Code Family Communication: None at bedside. Disposition:  Status is: Inpatient  Remains inpatient appropriate because:Inpatient level of care appropriate due to severity of illness   Dispo: The patient is from: Home              Anticipated d/c is to: Home              Anticipated d/c date is: 2 days              Patient currently is not medically stable to d/c.        Consultants:   None  Procedures:   None  Antimicrobials:    Anti-infectives (From admission, onward)   Start     Dose/Rate Route Frequency Ordered Stop   07/29/20 0400  vancomycin (VANCOCIN) IVPB 1000 mg/200 mL premix  Status:  Discontinued        1,000 mg 200 mL/hr over 60 Minutes Intravenous Every 12 hours 07/28/20 1535 07/29/20 1317   07/28/20 2300  piperacillin-tazobactam (ZOSYN) IVPB 3.375 g        3.375 g 12.5 mL/hr over 240 Minutes Intravenous Every 8 hours 07/28/20 1537     07/28/20 1545  vancomycin (VANCOREADY) IVPB 1500 mg/300 mL        1,500 mg 150 mL/hr over 120 Minutes Intravenous  Once 07/28/20 1535 07/28/20 2000   07/28/20 1545  piperacillin-tazobactam (ZOSYN) IVPB 3.375 g        3.375 g 100 mL/hr over 30 Minutes Intravenous  Once 07/28/20 1537 07/28/20 2143   07/28/20 1530  piperacillin-tazobactam (ZOSYN) IVPB 3.375 g  Status:  Discontinued        3.375 g 100 mL/hr over 30 Minutes Intravenous Every 8 hours 07/28/20 1521 07/28/20 1537   07/28/20 1245  levofloxacin (LEVAQUIN) IVPB 750 mg        750 mg 100 mL/hr over 90 Minutes Intravenous  Once 07/28/20 1234 07/28/20 1514        Subjective:    Overall feels better.  Fevers down.  Previous low back pain and leg pains have improved.  Denies sick contacts at home, denies insect bites, skin rashes, joint swelling.  Reports that he is married and independent.  Objective:   Vitals:   07/30/20 0429 07/30/20 0432 07/30/20 0920 07/30/20 1736  BP: 120/70  125/73 138/78  Pulse: 60  73 68  Resp: 15  17 18   Temp: 98.7 F (37.1 C)  98.2 F (36.8 C) 98.2 F (36.8 C)  TempSrc: Oral  Oral   SpO2: 93%  91% 95%  Weight:  86.1 kg    Height:        General exam: Pleasant elderly male, moderately built and nourished, lying comfortably propped up in bed without distress.  Does not look septic or toxic. Respiratory system: Occasional bibasal crackles but otherwise clear to auscultation. Respiratory effort normal. Cardiovascular system: S1 & S2 heard, RRR. No JVD, murmurs, rubs, gallops or clicks.  1+ pitting bilateral leg edema. Gastrointestinal system: Abdomen is nondistended, soft and nontender. No organomegaly or masses felt. Normal bowel sounds heard. Central nervous system: Alert and oriented. No focal neurological deficits. Extremities: Symmetric 5 x 5 power. Skin: No rashes, lesions or ulcers Psychiatry: Judgement and insight appear normal. Mood & affect appropriate.     Data Reviewed:   I have personally reviewed following labs and imaging studies   CBC: Recent Labs  Lab 07/28/20 1114 07/28/20 1114 07/28/20 2105 07/29/20 0658 07/30/20 0327  WBC 17.2*   < > 14.7* 16.8* 13.3*  NEUTROABS 14.6*  --   --  12.9* 10.2*  HGB 13.8   < > 12.8* 13.8 11.6*  HCT 40.0   < > 38.3* 40.4 34.9*  MCV 91.1   < > 93.6 92.0 93.1  PLT 515*   < > 432* 526* 417*   < > = values in this interval not displayed.    Basic Metabolic Panel: Recent Labs  Lab 07/28/20 1114 07/29/20 0658 07/30/20 0327  NA 129* 134* 136  K 3.4* 3.2* 3.8  CL 91* 96* 103  CO2 24 23 23   GLUCOSE 256* 153* 136*  BUN 28* 24* 24*  CREATININE 1.14 1.36* 1.35*   CALCIUM 8.5* 8.7* 8.1*  MG  --  1.8 1.8  PHOS  --  3.7  --     Liver Function Tests: Recent Labs  Lab 07/28/20 1114 07/29/20 0658 07/30/20 0327  AST 54* 47* 46*  ALT 57* 52* 47*  ALKPHOS 172* 154* 140*  BILITOT 0.6 0.6 0.8  PROT 6.1* 6.3* 5.2*  ALBUMIN 2.3* 2.2* 1.9*    CBG: No results for input(s): GLUCAP in the last 168 hours.  Microbiology Studies:   Recent  Results (from the past 240 hour(s))  Culture, blood (routine x 2)     Status: None (Preliminary result)   Collection Time: 07/28/20 12:33 PM   Specimen: BLOOD RIGHT FOREARM  Result Value Ref Range Status   Specimen Description BLOOD RIGHT FOREARM  Final   Special Requests   Final    BOTTLES DRAWN AEROBIC ONLY Blood Culture results may not be optimal due to an inadequate volume of blood received in culture bottles   Culture   Final    NO GROWTH 2 DAYS Performed at [facility] [address] [place], [place] [address]    Report Status PENDING  Incomplete  Respiratory Panel by RT PCR (Flu A&B, Covid) - Nasopharyngeal Swab     Status: None   Collection Time: 07/28/20 12:48 PM   Specimen: Nasopharyngeal Swab  Result Value Ref Range Status   SARS Coronavirus 2 by RT PCR NEGATIVE NEGATIVE Final    Comment: (NOTE) SARS-CoV-2 target nucleic acids are NOT DETECTED.  The SARS-CoV-2 RNA is generally detectable in upper respiratoy specimens during the acute phase of infection. The lowest concentration of SARS-CoV-2 viral copies this assay can detect is 131 copies/mL. A negative result does not preclude SARS-Cov-2 infection and should not be used as the sole basis for treatment or other patient management decisions. A negative result may occur with  improper specimen collection/handling, submission of specimen other than nasopharyngeal swab, presence of viral mutation(s) within the areas targeted by this assay, and inadequate number of viral copies (<131 copies/mL). A negative result must be combined with  clinical observations, patient history, and epidemiological information. The expected result is Negative.  Fact Sheet for Patients:  PinkCheek.be  Fact Sheet for Healthcare Providers:  GravelBags.it  This test is no t yet approved or cleared by the [place] FDA and  has been authorized for detection and/or diagnosis of SARS-CoV-2 by FDA under an Emergency Use Authorization (EUA). This EUA will remain  in effect (meaning this test can be used) for the duration of the COVID-19 declaration under Section 564(b)(1) of the Act, 21 U.S.C. section 360bbb-3(b)(1), unless the authorization is terminated or revoked sooner.     Influenza A by PCR NEGATIVE NEGATIVE Final   Influenza B by PCR NEGATIVE NEGATIVE Final    Comment: (NOTE) The Xpert Xpress SARS-CoV-2/FLU/RSV assay is intended as an aid in  the diagnosis of influenza from Nasopharyngeal swab specimens and  should not be used as a sole basis for treatment. Nasal washings and  aspirates are unacceptable for Xpert Xpress SARS-CoV-2/FLU/RSV  testing.  Fact Sheet for Patients: PinkCheek.be  Fact Sheet for Healthcare Providers: GravelBags.it  This test is not yet approved or cleared by the [place] FDA and  has been authorized for detection and/or diagnosis of SARS-CoV-2 by  FDA under an Emergency Use Authorization (EUA). This EUA will remain  in effect (meaning this test can be used) for the duration of the  Covid-19 declaration under Section 564(b)(1) of the Act, 21  U.S.C. section 360bbb-3(b)(1), unless the authorization is  terminated or revoked. Performed at [facility], [place] [address], [place], [place] [address]   Culture, blood (routine x 2)     Status: None (Preliminary result)   Collection Time: 07/28/20 12:50 PM   Specimen: BLOOD  Result Value Ref Range Status   Specimen Description BLOOD  LEFT ANTECUBITAL  Final   Special Requests   Final    BOTTLES DRAWN AEROBIC AND ANAEROBIC Blood Culture  adequate volume   Culture   Final    NO GROWTH 2 DAYS Performed at [facility], [place] [address] [place], [place] [address]    Report Status PENDING  Incomplete  MRSA PCR Screening     Status: None   Collection Time: 07/28/20  9:30 PM   Specimen: Nasopharyngeal  Result Value Ref Range Status   MRSA by PCR NEGATIVE NEGATIVE Final    Comment:        The GeneXpert MRSA Assay (FDA approved for NASAL specimens only), is one component of a comprehensive MRSA colonization surveillance program. It is not intended to diagnose MRSA infection nor to guide or monitor treatment for MRSA infections. Performed at [facility], [place] [address], [place], [place] [address]      Radiology Studies:  ECHOCARDIOGRAM COMPLETE  Result Date: 07/29/2020    ECHOCARDIOGRAM REPORT   Patient Name:   Douglas Erickson Date of Exam: 07/29/2020 Medical Rec #:  [id number]    Height:       72.0 in Accession #:    [id number]   Weight:       173.1 lb Date of Birth:  1945/11/14   BSA:          2.004 m Patient Age:    36 years     BP:           147/92 mmHg Patient Gender: M            HR:           80 bpm. Exam Location:  Inpatient Procedure: 2D Echo, Cardiac Doppler and Color Doppler Indications:    Fever  History:        Patient has no prior history of Echocardiogram examinations.                 Risk Factors:Hypertension, Diabetes and Dyslipidemia.  Sonographer:    Alvino Chapel RCS Referring Phys: [place]  1. Left ventricular ejection fraction, by estimation, is 60 to 65%. The left ventricle has normal function. The left ventricle has no regional wall motion abnormalities. Left ventricular diastolic parameters are indeterminate. Elevated left ventricular end-diastolic pressure.  2. Right ventricular systolic function is normal. The right ventricular size is normal. Tricuspid  regurgitation signal is inadequate for assessing PA pressure.  3. The mitral valve is normal in structure. Mild mitral valve regurgitation. No evidence of mitral stenosis.  4. The aortic valve is tricuspid. Aortic valve regurgitation is not visualized. Mild aortic valve sclerosis is present, with no evidence of aortic valve stenosis.  5. The inferior vena cava is normal in size with greater than 50% respiratory variability, suggesting right atrial pressure of 3 mmHg. FINDINGS  Left Ventricle: Left ventricular ejection fraction, by estimation, is 60 to 65%. The left ventricle has normal function. The left ventricle has no regional wall motion abnormalities. The left ventricular internal cavity size was normal in size. There is  no left ventricular hypertrophy. Left ventricular diastolic parameters are indeterminate. Elevated left ventricular end-diastolic pressure. Right Ventricle: The right ventricular size is normal. No increase in right ventricular wall thickness. Right ventricular systolic function is normal. Tricuspid regurgitation signal is inadequate for assessing PA pressure. Left Atrium: Left atrial size was normal in size. Right Atrium: Right atrial size was normal in size. Pericardium: There is no evidence of pericardial effusion. Mitral Valve: The mitral valve is normal in structure. Mild mitral valve regurgitation. No evidence of mitral valve stenosis.  Tricuspid Valve: The tricuspid valve is normal in structure. Tricuspid valve regurgitation is not demonstrated. No evidence of tricuspid stenosis. Aortic Valve: The aortic valve is tricuspid. Aortic valve regurgitation is not visualized. Mild aortic valve sclerosis is present, with no evidence of aortic valve stenosis. Pulmonic Valve: The pulmonic valve was normal in structure. Pulmonic valve regurgitation is not visualized. No evidence of pulmonic stenosis. Aorta: The aortic root is normal in size and structure. Venous: The inferior vena cava is normal in  size with greater than 50% respiratory variability, suggesting right atrial pressure of 3 mmHg. IAS/Shunts: No atrial level shunt detected by color flow Doppler.  LEFT VENTRICLE PLAX 2D LVIDd:         4.10 cm  Diastology LVIDs:         2.20 cm  LV e' medial:    6.53 cm/s LV PW:         1.00 cm  LV E/e' medial:  16.2 LV IVS:        1.10 cm  LV e' lateral:   8.16 cm/s LVOT diam:     1.80 cm  LV E/e' lateral: 13.0 LV SV:         60 LV SV Index:   30 LVOT Area:     2.54 cm  RIGHT VENTRICLE RV S prime:     13.70 cm/s TAPSE (M-mode): 2.5 cm LEFT ATRIUM             Index       RIGHT ATRIUM           Index LA diam:        3.30 cm 1.65 cm/m  RA Area:     14.70 cm LA Vol (A2C):   73.3 ml 36.59 ml/m RA Volume:   35.90 ml  17.92 ml/m LA Vol (A4C):   59.3 ml 29.60 ml/m LA Biplane Vol: 66.5 ml 33.19 ml/m  AORTIC VALVE LVOT Vmax:   129.00 cm/s LVOT Vmean:  79.400 cm/s LVOT VTI:    0.237 m  AORTA Ao Root diam: 3.10 cm MITRAL VALVE MV Area (PHT): 3.17 cm     SHUNTS MV Decel Time: 239 msec     Systemic VTI:  0.24 m MV E velocity: 106.00 cm/s  Systemic Diam: 1.80 cm MV A velocity: 85.60 cm/s MV E/A ratio:  1.24 Fransico Him MD Electronically signed by Fransico Him MD Signature Date/Time: 07/29/2020/2:18:57 PM    Final      Scheduled Meds:   . FLUoxetine  40 mg Oral Daily  . heparin  5,000 Units Subcutaneous Q8H  . multivitamin with minerals  1 tablet Oral Daily  . omega-3 acid ethyl esters  1 g Oral BID  . potassium chloride  40 mEq Oral Daily  . simvastatin  40 mg Oral Daily  . vitamin E  400 Units Oral Daily    Continuous Infusions:   . sodium chloride 125 mL/hr at 07/29/20 0925  . piperacillin-tazobactam (ZOSYN)  IV 3.375 g (07/30/20 1457)     LOS: 2 days     Vernell Leep, MD, [place], [facility]. [facility]    To contact the attending provider between 7A-7P or the covering provider during after hours 7P-7A, please log into the web site [email or web address] and access using universal [place]  password for that web site. If you do not have the password, please call the hospital operator.  07/30/2020, 6:38 PM

## 2020-07-31 LAB — COMPREHENSIVE METABOLIC PANEL
ALT: 46 U/L — ABNORMAL HIGH (ref 0–44)
AST: 47 U/L — ABNORMAL HIGH (ref 15–41)
Albumin: 1.7 g/dL — ABNORMAL LOW (ref 3.5–5.0)
Alkaline Phosphatase: 160 U/L — ABNORMAL HIGH (ref 38–126)
Anion gap: 12 (ref 5–15)
BUN: 24 mg/dL — ABNORMAL HIGH (ref 8–23)
CO2: 21 mmol/L — ABNORMAL LOW (ref 22–32)
Calcium: 8.2 mg/dL — ABNORMAL LOW (ref 8.9–10.3)
Chloride: 103 mmol/L (ref 98–111)
Creatinine, Ser: 1.27 mg/dL — ABNORMAL HIGH (ref 0.61–1.24)
GFR, Estimated: >60 mL/min (ref >60)
Glucose, Bld: 115 mg/dL — ABNORMAL HIGH (ref 70–99)
Potassium: 3.8 mmol/L (ref 3.5–5.1)
Sodium: 136 mmol/L (ref 135–145)
Total Bilirubin: 0.6 mg/dL (ref 0.3–1.2)
Total Protein: 5 g/dL — ABNORMAL LOW (ref 6.5–8.1)

## 2020-07-31 LAB — CBC WITH DIFFERENTIAL/PLATELET
Abs Immature Granulocytes: 0.37 10*3/uL — ABNORMAL HIGH (ref 0.00–0.07)
Basophils Absolute: 0.1 10*3/uL (ref 0.0–0.1)
Basophils Relative: 0 %
Eosinophils Absolute: 0.5 10*3/uL (ref 0.0–0.5)
Eosinophils Relative: 4 %
HCT: 35.6 % — ABNORMAL LOW (ref 39.0–52.0)
Hemoglobin: 11.7 g/dL — ABNORMAL LOW (ref 13.0–17.0)
Immature Granulocytes: 3 %
Lymphocytes Relative: 13 %
Lymphs Abs: 1.9 10*3/uL (ref 0.7–4.0)
MCH: 31.1 pg (ref 26.0–34.0)
MCHC: 32.9 g/dL (ref 30.0–36.0)
MCV: 94.7 fL (ref 80.0–100.0)
Monocytes Absolute: 0.9 10*3/uL (ref 0.1–1.0)
Monocytes Relative: 6 %
Neutro Abs: 11 10*3/uL — ABNORMAL HIGH (ref 1.7–7.7)
Neutrophils Relative %: 74 %
Platelets: 431 10*3/uL — ABNORMAL HIGH (ref 150–400)
RBC: 3.76 MIL/uL — ABNORMAL LOW (ref 4.22–5.81)
RDW: 12.7 % (ref 11.5–15.5)
WBC: 14.8 10*3/uL — ABNORMAL HIGH (ref 4.0–10.5)
nRBC: 0 % (ref 0.0–0.2)

## 2020-07-31 LAB — LEGIONELLA PNEUMOPHILA SEROGP 1 UR AG: L. pneumophila Serogp 1 Ur Ag: NEGATIVE

## 2020-07-31 MED ORDER — CEFDINIR 300 MG PO CAPS
300.0000 mg | ORAL_CAPSULE | Freq: Two times a day (BID) | ORAL | Status: DC
  Administered 2020-07-31 – 2020-08-01 (×3): 300 mg via ORAL
  Filled 2020-07-31 (×3): qty 1

## 2020-07-31 MED ORDER — ONDANSETRON HCL 4 MG PO TABS
4.0000 mg | ORAL_TABLET | Freq: Three times a day (TID) | ORAL | Status: DC | PRN
  Administered 2020-07-31: 4 mg via ORAL
  Filled 2020-07-31 (×2): qty 1

## 2020-07-31 NOTE — Progress Notes (Addendum)
PROGRESS NOTE   Douglas Erickson  HDQ:[id number]    DOB: November 27, 1945    DOA: 07/28/2020  PCP: Patient, No Pcp Per   I have briefly reviewed patients previous medical records in [facility].  Chief Complaint  Patient presents with  . Pneumonia    Brief Narrative:  74 year old male with PMH of HTN, DM 2, HLD, presented to ED due to fever, back pain and diaphoresis.  He has been symptomatic since 07/15/2020.  He was diagnosed with pneumonia at the [facility] ED on 10/27 and discharged on azithromycin but continued to have fevers and not eating or drinking much.  Admitted for pneumonia that failed outpatient antibiotic.  However uncertain regarding his pneumonia diagnosis.  IV antibiotics transition to oral on 11/2 and by afternoon patient again with low-grade fevers of 100.7 F with nausea.  ID consulted and will see patient on 11/3.  Assessment & Plan:  Active Problems:   DM (diabetes mellitus) ([place])   Benign essential HTN   Hyperlipidemia   Depression   Fever   Community acquired pneumonia of left lower lobe of lung   Fever, unclear etiology Initially admitted as suspected pneumonia that failed outpatient treatment.  However CTA chest on admission showed no evidence of PE and no comment made about consolidation or pneumonia.  Had trace bilateral pleural effusions with atelectasis at the lung bases.  Also no acute intra-abdominal or pelvic process or lumbar spine process on CT abdomen and L-spine.  Started empirically on IV Zosyn, vancomycin discontinued. MRSA PCR negative.  RSV, flu, COVID-19 PCR negative.  Urine microscopy not suggestive of UTI.  Blood cultures x2: Negative to date.  Urine pneumococcal antigen negative, Legionella antigen pending.  Blood cultures x2: Negative to date.  Patient had defervesced, clinically doing well, still had mild neutrophilic leukocytosis.  He was switched from broad-spectrum IV antibiotics to cefdinir on 11/2 morning and by the afternoon again developed  fever of 100.7 F.  Unclear etiology of his ongoing low-grade fevers- ongoing for ~ 2 weeks.  Discussed in detail with Dr. Linus Salmons, ID, continue cefdinir for now, may be viral etiology, he will formally consult on 11/3.  He is hemodynamically stable.  Acute kidney injury May be multifactorial due to dehydration on admission, infection and also got contrast.  BMP 1.14 on admission which had peaked to 1.36.  Improved to 1.27.  Encouraged oral fluid intake.  Follow BMP.  Avoid nephrotoxic's.  Hypokalemia Replaced.  Magnesium normal.  Dehydration with hyponatremia:  Resolved after IV fluids.  Discontinued IV fluids  Anemia Possibly dilutional.  Also has neutrophilic leukocytosis.  Mild LFT abnormalities Stable with AST and ALT in the 40s and normal total bilirubin.  Unclear etiology,?  Related to primary etiology/fever.  ID checking CMV, EBV etc.  Depression Continue Prozac.  Essential hypertension Controlled.  HCTZ on hold.  Constipation Large stool burden noted on CT abdomen on admission but patient having regular BMs since then.  Psoriasis Patient unable to tell the name of medication that he is taking at home   DVT prophylaxis: heparin injection 5,000 Units Start: 07/28/20 2200 SCDs Start: 07/28/20 1836     Code Status: Full Code Family Communication: I discussed with patient's spouse in detail, updated care and answered questions.  Disposition:  Status is: Inpatient  Remains inpatient appropriate because:Inpatient level of care appropriate due to severity of illness   Dispo: The patient is from: Home              Anticipated  d/c is to: Home              Anticipated d/c date is: 2 days              Patient currently is not medically stable to d/c.        Consultants:   ID-we will see 11/3  Procedures:   None  Antimicrobials:    Anti-infectives (From admission, onward)   Start     Dose/Rate Route Frequency Ordered Stop   07/31/20 1200  cefdinir (OMNICEF)  capsule 300 mg        300 mg Oral Every 12 hours 07/31/20 0854 08/04/20 0959   07/29/20 0400  vancomycin (VANCOCIN) IVPB 1000 mg/200 mL premix  Status:  Discontinued        1,000 mg 200 mL/hr over 60 Minutes Intravenous Every 12 hours 07/28/20 1535 07/29/20 1317   07/28/20 2300  piperacillin-tazobactam (ZOSYN) IVPB 3.375 g  Status:  Discontinued        3.375 g 12.5 mL/hr over 240 Minutes Intravenous Every 8 hours 07/28/20 1537 07/31/20 0854   07/28/20 1545  vancomycin (VANCOREADY) IVPB 1500 mg/300 mL        1,500 mg 150 mL/hr over 120 Minutes Intravenous  Once 07/28/20 1535 07/28/20 2000   07/28/20 1545  piperacillin-tazobactam (ZOSYN) IVPB 3.375 g        3.375 g 100 mL/hr over 30 Minutes Intravenous  Once 07/28/20 1537 07/28/20 2143   07/28/20 1530  piperacillin-tazobactam (ZOSYN) IVPB 3.375 g  Status:  Discontinued        3.375 g 100 mL/hr over 30 Minutes Intravenous Every 8 hours 07/28/20 1521 07/28/20 1537   07/28/20 1245  levofloxacin (LEVAQUIN) IVPB 750 mg        750 mg 100 mL/hr over 90 Minutes Intravenous  Once 07/28/20 1234 07/28/20 1514        Subjective:  When seen this morning, patient reported continued improvement.  Reported bilateral leg swelling/edema.  Indicated that he has been having drenching sweats after Tylenol.  Having regular BMs since hospital admission.  States that he and his wife took pneumonia shot almost 2 years ago.  This afternoon, RN paged to indicate fever of 100.7 F and nausea.  Objective:   Vitals:   07/31/20 0911 07/31/20 0942 07/31/20 0952 07/31/20 1628  BP: 128/75 131/70  137/73  Pulse: 77 70  74  Resp: 19 18  18   Temp: 98.2 F (36.8 C) 97.7 F (36.5 C) 97.7 F (36.5 C) (!) 100.7 F (38.2 C)  TempSrc: Oral Oral Oral Oral  SpO2: 95% 95%  96%  Weight:      Height:        General exam: Pleasant elderly male, moderately built and nourished, lying comfortably propped up in bed without distress.  Does not look septic or  toxic. Respiratory system: Clear to auscultation.  No increased work of breathing. Cardiovascular system: S1 & S2 heard, RRR. No JVD, murmurs, rubs, gallops or clicks.  1+ pitting bilateral leg edema.  Stable without change. Gastrointestinal system: Abdomen is nondistended, soft and nontender. No organomegaly or masses felt. Normal bowel sounds heard. Central nervous system: Alert and oriented. No focal neurological deficits. Extremities: Symmetric 5 x 5 power. Skin: No rashes, lesions or ulcers Psychiatry: Judgement and insight appear normal. Mood & affect appropriate.     Data Reviewed:   I have personally reviewed following labs and imaging studies   CBC: Recent Labs  Lab 07/29/20 0658 07/30/20 0327 07/31/20  0115  WBC 16.8* 13.3* 14.8*  NEUTROABS 12.9* 10.2* 11.0*  HGB 13.8 11.6* 11.7*  HCT 40.4 34.9* 35.6*  MCV 92.0 93.1 94.7  PLT 526* 417* 431*    Basic Metabolic Panel: Recent Labs  Lab 07/29/20 0658 07/30/20 0327 07/31/20 0115  NA 134* 136 136  K 3.2* 3.8 3.8  CL 96* 103 103  CO2 23 23 21*  GLUCOSE 153* 136* 115*  BUN 24* 24* 24*  CREATININE 1.36* 1.35* 1.27*  CALCIUM 8.7* 8.1* 8.2*  MG 1.8 1.8  --   PHOS 3.7  --   --     Liver Function Tests: Recent Labs  Lab 07/29/20 0658 07/30/20 0327 07/31/20 0115  AST 47* 46* 47*  ALT 52* 47* 46*  ALKPHOS 154* 140* 160*  BILITOT 0.6 0.8 0.6  PROT 6.3* 5.2* 5.0*  ALBUMIN 2.2* 1.9* 1.7*    CBG: No results for input(s): GLUCAP in the last 168 hours.  Microbiology Studies:   Recent Results (from the past 240 hour(s))  Culture, blood (routine x 2)     Status: None (Preliminary result)   Collection Time: 07/28/20 12:33 PM   Specimen: BLOOD RIGHT FOREARM  Result Value Ref Range Status   Specimen Description BLOOD RIGHT FOREARM  Final   Special Requests   Final    BOTTLES DRAWN AEROBIC ONLY Blood Culture results may not be optimal due to an inadequate volume of blood received in culture bottles   Culture    Final    NO GROWTH 3 DAYS Performed at [facility], [place] [address] [place], [place] [address]    Report Status PENDING  Incomplete  Respiratory Panel by RT PCR (Flu A&B, Covid) - Nasopharyngeal Swab     Status: None   Collection Time: 07/28/20 12:48 PM   Specimen: Nasopharyngeal Swab  Result Value Ref Range Status   SARS Coronavirus 2 by RT PCR NEGATIVE NEGATIVE Final    Comment: (NOTE) SARS-CoV-2 target nucleic acids are NOT DETECTED.  The SARS-CoV-2 RNA is generally detectable in upper respiratoy specimens during the acute phase of infection. The lowest concentration of SARS-CoV-2 viral copies this assay can detect is 131 copies/mL. A negative result does not preclude SARS-Cov-2 infection and should not be used as the sole basis for treatment or other patient management decisions. A negative result may occur with  improper specimen collection/handling, submission of specimen other than nasopharyngeal swab, presence of viral mutation(s) within the areas targeted by this assay, and inadequate number of viral copies (<131 copies/mL). A negative result must be combined with clinical observations, patient history, and epidemiological information. The expected result is Negative.  Fact Sheet for Patients:  PinkCheek.be  Fact Sheet for Healthcare Providers:  GravelBags.it  This test is no t yet approved or cleared by the [place] FDA and  has been authorized for detection and/or diagnosis of SARS-CoV-2 by FDA under an Emergency Use Authorization (EUA). This EUA will remain  in effect (meaning this test can be used) for the duration of the COVID-19 declaration under Section 564(b)(1) of the Act, 21 U.S.C. section 360bbb-3(b)(1), unless the authorization is terminated or revoked sooner.     Influenza A by PCR NEGATIVE NEGATIVE Final   Influenza B by PCR NEGATIVE NEGATIVE Final    Comment: (NOTE) The Xpert  Xpress SARS-CoV-2/FLU/RSV assay is intended as an aid in  the diagnosis of influenza from Nasopharyngeal swab specimens and  should not be used as a sole basis for treatment. Nasal washings  and  aspirates are unacceptable for Xpert Xpress SARS-CoV-2/FLU/RSV  testing.  Fact Sheet for Patients: PinkCheek.be  Fact Sheet for Healthcare Providers: GravelBags.it  This test is not yet approved or cleared by the [place] FDA and  has been authorized for detection and/or diagnosis of SARS-CoV-2 by  FDA under an Emergency Use Authorization (EUA). This EUA will remain  in effect (meaning this test can be used) for the duration of the  Covid-19 declaration under Section 564(b)(1) of the Act, 21  U.S.C. section 360bbb-3(b)(1), unless the authorization is  terminated or revoked. Performed at [facility], [place] [address] [place], [place] [address]   Culture, blood (routine x 2)     Status: None (Preliminary result)   Collection Time: 07/28/20 12:50 PM   Specimen: BLOOD  Result Value Ref Range Status   Specimen Description BLOOD LEFT ANTECUBITAL  Final   Special Requests   Final    BOTTLES DRAWN AEROBIC AND ANAEROBIC Blood Culture adequate volume   Culture   Final    NO GROWTH 3 DAYS Performed at [facility], [place] [address] [place], [place] [address]    Report Status PENDING  Incomplete  MRSA PCR Screening     Status: None   Collection Time: 07/28/20  9:30 PM   Specimen: Nasopharyngeal  Result Value Ref Range Status   MRSA by PCR NEGATIVE NEGATIVE Final    Comment:        The GeneXpert MRSA Assay (FDA approved for NASAL specimens only), is one component of a comprehensive MRSA colonization surveillance program. It is not intended to diagnose MRSA infection nor to guide or monitor treatment for MRSA infections. Performed at [facility], [place] [address], [place], [place] [address]      Radiology  Studies:  No results found.   Scheduled Meds:   . cefdinir  300 mg Oral Q12H  . FLUoxetine  40 mg Oral Daily  . heparin  5,000 Units Subcutaneous Q8H  . multivitamin with minerals  1 tablet Oral Daily  . omega-3 acid ethyl esters  1 g Oral BID  . potassium chloride  40 mEq Oral Daily  . simvastatin  40 mg Oral Daily  . vitamin E  400 Units Oral Daily    Continuous Infusions:      LOS: 3 days     Vernell Leep, MD, [place], [facility]. [facility]    To contact the attending provider between 7A-7P or the covering provider during after hours 7P-7A, please log into the web site [email or web address] and access using universal [place] password for that web site. If you do not have the password, please call the hospital operator.  07/31/2020, 5:32 PM

## 2020-08-01 LAB — COMPREHENSIVE METABOLIC PANEL
ALT: 55 U/L — ABNORMAL HIGH (ref 0–44)
AST: 59 U/L — ABNORMAL HIGH (ref 15–41)
Albumin: 1.7 g/dL — ABNORMAL LOW (ref 3.5–5.0)
Alkaline Phosphatase: 200 U/L — ABNORMAL HIGH (ref 38–126)
Anion gap: 10 (ref 5–15)
BUN: 20 mg/dL (ref 8–23)
CO2: 22 mmol/L (ref 22–32)
Calcium: 8.2 mg/dL — ABNORMAL LOW (ref 8.9–10.3)
Chloride: 104 mmol/L (ref 98–111)
Creatinine, Ser: 1.1 mg/dL (ref 0.61–1.24)
GFR, Estimated: >60 mL/min (ref >60)
Glucose, Bld: 106 mg/dL — ABNORMAL HIGH (ref 70–99)
Potassium: 3.9 mmol/L (ref 3.5–5.1)
Sodium: 136 mmol/L (ref 135–145)
Total Bilirubin: 0.6 mg/dL (ref 0.3–1.2)
Total Protein: 5.1 g/dL — ABNORMAL LOW (ref 6.5–8.1)

## 2020-08-01 LAB — CBC WITH DIFFERENTIAL/PLATELET
Abs Immature Granulocytes: 0.45 10*3/uL — ABNORMAL HIGH (ref 0.00–0.07)
Basophils Absolute: 0.1 10*3/uL (ref 0.0–0.1)
Basophils Relative: 0 %
Eosinophils Absolute: 0.5 10*3/uL (ref 0.0–0.5)
Eosinophils Relative: 3 %
HCT: 32.9 % — ABNORMAL LOW (ref 39.0–52.0)
Hemoglobin: 11.1 g/dL — ABNORMAL LOW (ref 13.0–17.0)
Immature Granulocytes: 3 %
Lymphocytes Relative: 10 %
Lymphs Abs: 1.6 10*3/uL (ref 0.7–4.0)
MCH: 31.6 pg (ref 26.0–34.0)
MCHC: 33.7 g/dL (ref 30.0–36.0)
MCV: 93.7 fL (ref 80.0–100.0)
Monocytes Absolute: 0.8 10*3/uL (ref 0.1–1.0)
Monocytes Relative: 5 %
Neutro Abs: 12.7 10*3/uL — ABNORMAL HIGH (ref 1.7–7.7)
Neutrophils Relative %: 79 %
Platelets: 439 10*3/uL — ABNORMAL HIGH (ref 150–400)
RBC: 3.51 MIL/uL — ABNORMAL LOW (ref 4.22–5.81)
RDW: 12.6 % (ref 11.5–15.5)
WBC: 16.1 10*3/uL — ABNORMAL HIGH (ref 4.0–10.5)
nRBC: 0 % (ref 0.0–0.2)

## 2020-08-01 LAB — EPSTEIN-BARR VIRUS (EBV) ANTIBODY PROFILE
EBV NA IgG: <18 U/mL (ref 0.0–17.9)
EBV VCA IgG: <18 U/mL (ref 0.0–17.9)
EBV VCA IgM: <36 U/mL (ref 0.0–35.9)

## 2020-08-01 LAB — LEGIONELLA PNEUMOPHILA SEROGP 1 UR AG: L. pneumophila Serogp 1 Ur Ag: NEGATIVE

## 2020-08-01 NOTE — Progress Notes (Signed)
PROGRESS NOTE    ZHAIRE LOCKER  NKN:[id number] DOB: 01-Dec-1945 DOA: 07/28/2020 PCP: Patient, No Pcp Per    Brief Narrative:  This 74 year old male with PMH of HTN, DM 2, HLD, presented to ED due to fever, back pain and diaphoresis.  He has intermittent fever since 07/15/2020.  He was diagnosed with pneumonia at the [facility] ED on 10/27 and was discharged on azithromycin but continued to have fevers and not eating or drinking much.  Admitted for pneumonia that failed outpatient antibiotic.  However uncertain regarding his pneumonia diagnosis.  IV antibiotics transition to oral on 11/2 and by afternoon patient again with low-grade fevers of 100.7 F with nausea.  ID consulted advised to discontinue antibiotics , this appears viral since all workup so far unremarkable.  Assessment & Plan:   Principal Problem:   Fever Active Problems:   DM (diabetes mellitus) ([place])   Benign essential HTN   Hyperlipidemia   Depression   Community acquired pneumonia of left lower lobe of lung   Fever, unclear etiology: Initially admitted for suspected pneumonia that failed outpatient treatment.  However CTA chest on admission showed no evidence of PE and no comment made about consolidation or pneumonia.  He had trace bilateral pleural effusions with atelectasis at the lung bases.  Also no acute intra-abdominal or pelvic process or lumbar spine process on CT abdomen and L-spine.  Started empirically on IV Zosyn, vancomycin discontinued. MRSA PCR negative.  RSV, flu, COVID-19 PCR negative.  Urine microscopy not suggestive of UTI.  Blood cultures x 2: Negative to date.  Urine pneumococcal antigen negative, Legionella antigen negative.   Patient had defervesced, clinically doing well, still had mild neutrophilic leukocytosis.  He was switched from broad-spectrum IV antibiotics to cefdinir on 11/2 morning and by the afternoon again developed fever of 100.7 F.  Unclear etiology of his ongoing low-grade fevers- ongoing  for ~ 2 weeks.  He is hemodynamically stable.  Infectious disease consulted,  advised to discontinue antibiotics.  Etiology seems viral since all the work-up has been unremarkable.  Acute kidney injury >>> Improved May be multifactorial due to dehydration on admission, infection and also got contrast.  BMP 1.14 on admission which had peaked to 1.36.  Improved to 1.27.  Encouraged oral fluid intake.  Follow BMP.  Avoid nephrotoxic's.  Hypokalemia >>> Improved. Replaced.  Magnesium normal.  Dehydration with hyponatremia:  >>> Resolved Resolved after IV fluids.  Discontinued IV fluids  Anemia Possibly dilutional.  Also has neutrophilic leukocytosis.  Mild LFT abnormalities Stable with AST and ALT in the 40s and normal total bilirubin.  Unclear etiology,? Related to primary etiology/fever.  ID ordered CMV, EBV etc.  Depression Continue Prozac.  Essential hypertension Controlled.  HCTZ on hold.  Constipation Large stool burden noted on CT abdomen on admission but patient having regular BMs since then.  Psoriasis Patient unable to tell the name of medication that he is taking at home. Stable, not in flare.    DVT prophylaxis: Heparin Code Status: Full code. Family Communication: No one at bed side. Disposition Plan:   Status is: Inpatient  Remains inpatient appropriate because:Inpatient level of care appropriate due to severity of illness   Dispo: The patient is from: Home              Anticipated d/c is to: Home              Anticipated d/c date is: 1 day  Patient currently is not medically stable to d/c.  Consultants:   Infectious Diseases.  Procedures:  None Antimicrobials: Anti-infectives (From admission, onward)   Start     Dose/Rate Route Frequency Ordered Stop   07/31/20 1200  cefdinir (OMNICEF) capsule 300 mg  Status:  Discontinued        300 mg Oral Every 12 hours 07/31/20 0854 08/01/20 1156   07/29/20 0400  vancomycin (VANCOCIN) IVPB  1000 mg/200 mL premix  Status:  Discontinued        1,000 mg 200 mL/hr over 60 Minutes Intravenous Every 12 hours 07/28/20 1535 07/29/20 1317   07/28/20 2300  piperacillin-tazobactam (ZOSYN) IVPB 3.375 g  Status:  Discontinued        3.375 g 12.5 mL/hr over 240 Minutes Intravenous Every 8 hours 07/28/20 1537 07/31/20 0854   07/28/20 1545  vancomycin (VANCOREADY) IVPB 1500 mg/300 mL        1,500 mg 150 mL/hr over 120 Minutes Intravenous  Once 07/28/20 1535 07/28/20 2000   07/28/20 1545  piperacillin-tazobactam (ZOSYN) IVPB 3.375 g        3.375 g 100 mL/hr over 30 Minutes Intravenous  Once 07/28/20 1537 07/28/20 2143   07/28/20 1530  piperacillin-tazobactam (ZOSYN) IVPB 3.375 g  Status:  Discontinued        3.375 g 100 mL/hr over 30 Minutes Intravenous Every 8 hours 07/28/20 1521 07/28/20 1537   07/28/20 1245  levofloxacin (LEVAQUIN) IVPB 750 mg        750 mg 100 mL/hr over 90 Minutes Intravenous  Once 07/28/20 1234 07/28/20 1514     Subjective: Patient was seen and examined at bedside.  Overnight events noted.  Patient reports feeling generally weak but denies any other concerns or complaints.  Wife is at bedside,  concerned about him having fever again.  Objective: Vitals:   08/01/20 0459 08/01/20 0500 08/01/20 0936 08/01/20 1315  BP: 131/72  136/71   Pulse: 75  74   Resp: 20  16   Temp: 98.2 F (36.8 C)  97.9 F (36.6 C) 97.9 F (36.6 C)  TempSrc: Oral  Oral Oral  SpO2: 93%  92%   Weight:  86.1 kg    Height:        Intake/Output Summary (Last 24 hours) at 08/01/2020 1425 Last data filed at 08/01/2020 1020 Gross per 24 hour  Intake --  Output 700 ml  Net -700 ml   Filed Weights   07/29/20 2019 07/30/20 0432 08/01/20 0500  Weight: 86.1 kg 86.1 kg 86.1 kg    Examination:  General exam: Appears calm and comfortable  Respiratory system: Clear to auscultation. Respiratory effort normal. Cardiovascular system: S1 & S2 heard, RRR. No JVD, murmurs, rubs, gallops or  clicks. No pedal edema. Gastrointestinal system: Abdomen is nondistended, soft and nontender. No organomegaly or masses felt. Normal bowel sounds heard. Central nervous system: Alert and oriented. No focal neurological deficits. Extremities: No edema, no cyanosis, no clubbing. Skin: No rashes, lesions or ulcers Psychiatry: Judgement and insight appear normal. Mood & affect appropriate.     Data Reviewed: I have personally reviewed following labs and imaging studies  CBC: Recent Labs  Lab 07/28/20 1114 07/28/20 1114 07/28/20 2105 07/29/20 0658 07/30/20 0327 07/31/20 0115 08/01/20 0146  WBC 17.2*   < > 14.7* 16.8* 13.3* 14.8* 16.1*  NEUTROABS 14.6*  --   --  12.9* 10.2* 11.0* 12.7*  HGB 13.8   < > 12.8* 13.8 11.6* 11.7* 11.1*  HCT 40.0   < >  38.3* 40.4 34.9* 35.6* 32.9*  MCV 91.1   < > 93.6 92.0 93.1 94.7 93.7  PLT 515*   < > 432* 526* 417* 431* 439*   < > = values in this interval not displayed.   Basic Metabolic Panel: Recent Labs  Lab 07/28/20 1114 07/29/20 0658 07/30/20 0327 07/31/20 0115 08/01/20 0146  NA 129* 134* 136 136 136  K 3.4* 3.2* 3.8 3.8 3.9  CL 91* 96* 103 103 104  CO2 24 23 23  21* 22  GLUCOSE 256* 153* 136* 115* 106*  BUN 28* 24* 24* 24* 20  CREATININE 1.14 1.36* 1.35* 1.27* 1.10  CALCIUM 8.5* 8.7* 8.1* 8.2* 8.2*  MG  --  1.8 1.8  --   --   PHOS  --  3.7  --   --   --    GFR: Estimated Creatinine Clearance: 67.6 mL/min (by C-G formula based on SCr of 1.1 mg/dL). Liver Function Tests: Recent Labs  Lab 07/28/20 1114 07/29/20 0658 07/30/20 0327 07/31/20 0115 08/01/20 0146  AST 54* 47* 46* 47* 59*  ALT 57* 52* 47* 46* 55*  ALKPHOS 172* 154* 140* 160* 200*  BILITOT 0.6 0.6 0.8 0.6 0.6  PROT 6.1* 6.3* 5.2* 5.0* 5.1*  ALBUMIN 2.3* 2.2* 1.9* 1.7* 1.7*   Recent Labs  Lab 07/28/20 1438  LIPASE 20   No results for input(s): AMMONIA in the last 168 hours. Coagulation Profile: No results for input(s): INR, PROTIME in the last 168  hours. Cardiac Enzymes: No results for input(s): CKTOTAL, CKMB, CKMBINDEX, TROPONINI in the last 168 hours. BNP (last 3 results) No results for input(s): PROBNP in the last 8760 hours. HbA1C: No results for input(s): HGBA1C in the last 72 hours. CBG: No results for input(s): GLUCAP in the last 168 hours. Lipid Profile: No results for input(s): CHOL, HDL, LDLCALC, TRIG, CHOLHDL, LDLDIRECT in the last 72 hours. Thyroid Function Tests: No results for input(s): TSH, T4TOTAL, FREET4, T3FREE, THYROIDAB in the last 72 hours. Anemia Panel: No results for input(s): VITAMINB12, FOLATE, FERRITIN, TIBC, IRON, RETICCTPCT in the last 72 hours. Sepsis Labs: Recent Labs  Lab 07/28/20 1114 07/28/20 1349 07/28/20 1407  PROCALCITON  --  0.95  --   LATICACIDVEN 1.8  --  2.4*    Recent Results (from the past 240 hour(s))  Culture, blood (routine x 2)     Status: None (Preliminary result)   Collection Time: 07/28/20 12:33 PM   Specimen: BLOOD RIGHT FOREARM  Result Value Ref Range Status   Specimen Description BLOOD RIGHT FOREARM  Final   Special Requests   Final    BOTTLES DRAWN AEROBIC ONLY Blood Culture results may not be optimal due to an inadequate volume of blood received in culture bottles   Culture   Final    NO GROWTH 4 DAYS Performed at [facility], [place] [address] [place], [place] [address]    Report Status PENDING  Incomplete  Respiratory Panel by RT PCR (Flu A&B, Covid) - Nasopharyngeal Swab     Status: None   Collection Time: 07/28/20 12:48 PM   Specimen: Nasopharyngeal Swab  Result Value Ref Range Status   SARS Coronavirus 2 by RT PCR NEGATIVE NEGATIVE Final    Comment: (NOTE) SARS-CoV-2 target nucleic acids are NOT DETECTED.  The SARS-CoV-2 RNA is generally detectable in upper respiratoy specimens during the acute phase of infection. The lowest concentration of SARS-CoV-2 viral copies this assay can detect is 131 copies/mL. A negative result does not preclude  SARS-Cov-2 infection and should not be used as the sole basis for treatment or other patient management decisions. A negative result may occur with  improper specimen collection/handling, submission of specimen other than nasopharyngeal swab, presence of viral mutation(s) within the areas targeted by this assay, and inadequate number of viral copies (<131 copies/mL). A negative result must be combined with clinical observations, patient history, and epidemiological information. The expected result is Negative.  Fact Sheet for Patients:  PinkCheek.be  Fact Sheet for Healthcare Providers:  GravelBags.it  This test is no t yet approved or cleared by the [place] FDA and  has been authorized for detection and/or diagnosis of SARS-CoV-2 by FDA under an Emergency Use Authorization (EUA). This EUA will remain  in effect (meaning this test can be used) for the duration of the COVID-19 declaration under Section 564(b)(1) of the Act, 21 U.S.C. section 360bbb-3(b)(1), unless the authorization is terminated or revoked sooner.     Influenza A by PCR NEGATIVE NEGATIVE Final   Influenza B by PCR NEGATIVE NEGATIVE Final    Comment: (NOTE) The Xpert Xpress SARS-CoV-2/FLU/RSV assay is intended as an aid in  the diagnosis of influenza from Nasopharyngeal swab specimens and  should not be used as a sole basis for treatment. Nasal washings and  aspirates are unacceptable for Xpert Xpress SARS-CoV-2/FLU/RSV  testing.  Fact Sheet for Patients: PinkCheek.be  Fact Sheet for Healthcare Providers: GravelBags.it  This test is not yet approved or cleared by the [place] FDA and  has been authorized for detection and/or diagnosis of SARS-CoV-2 by  FDA under an Emergency Use Authorization (EUA). This EUA will remain  in effect (meaning this test can be used) for the duration of the   Covid-19 declaration under Section 564(b)(1) of the Act, 21  U.S.C. section 360bbb-3(b)(1), unless the authorization is  terminated or revoked. Performed at [facility], [place] [address] [place], [place] [address]   Culture, blood (routine x 2)     Status: None (Preliminary result)   Collection Time: 07/28/20 12:50 PM   Specimen: BLOOD  Result Value Ref Range Status   Specimen Description BLOOD LEFT ANTECUBITAL  Final   Special Requests   Final    BOTTLES DRAWN AEROBIC AND ANAEROBIC Blood Culture adequate volume   Culture   Final    NO GROWTH 4 DAYS Performed at [facility], [place] [address] [place], [place] [address]    Report Status PENDING  Incomplete  MRSA PCR Screening     Status: None   Collection Time: 07/28/20  9:30 PM   Specimen: Nasopharyngeal  Result Value Ref Range Status   MRSA by PCR NEGATIVE NEGATIVE Final    Comment:        The GeneXpert MRSA Assay (FDA approved for NASAL specimens only), is one component of a comprehensive MRSA colonization surveillance program. It is not intended to diagnose MRSA infection nor to guide or monitor treatment for MRSA infections. Performed at [facility], [place] [address] [place], [place] [address]     Radiology Studies: No results found.   Scheduled Meds: . FLUoxetine  40 mg Oral Daily  . heparin  5,000 Units Subcutaneous Q8H  . multivitamin with minerals  1 tablet Oral Daily  . omega-3 acid ethyl esters  1 g Oral BID  . potassium chloride  40 mEq Oral Daily  . simvastatin  40 mg Oral Daily  . vitamin E  400 Units Oral Daily   Continuous  Infusions:   LOS: 4 days    Time spent: 25 mins    Shawna Clamp, MD [facility]   If 7PM-7AM, please contact night-coverage

## 2020-08-01 NOTE — Care Management Important Message (Signed)
Important Message  Patient Details  Name: Douglas Erickson MRN: [id number] Date of Birth: [birth year]   Medicare Important Message Given:  Yes - Important Message mailed due to current National Emergency  Verbal consent obtained due to current National Emergency  Relationship to patient: Self Contact Name: Zafir Schauer Call Date: 08/01/20  Time: 0929 Phone: [phone] Outcome: Spoke with contact Important Message mailed to: Patient address on file    Delorse Lek 08/01/2020, 9:29 AM

## 2020-08-01 NOTE — Consult Note (Addendum)
[facility]    Date of Admission:  07/28/2020      Total days of antibiotics 4  Cefdinir day 2   Reason for Consult: Fever, leukocytosis     Referring Provider: [facility]  Primary Care Provider: Patient, No Pcp Per    Assessment: Douglas Erickson is a 74 y.o. male admitted with 3 week history of burning/cramping lower leg pain, new onset back pain and subjective fevers. The cause of his fevers and current illness is not clear. He did have some improvement since admission but I suspect that was probably more related to re-hydration vs IV antibiotics.  Echocardiogram is normal without any concerning findings to suggest endocarditis. Blood cultures remain no growth. CT scans are all reassuring without any foci of bacterial infection, malignancy or lymphadenopathy. I do not think bacterial pneumonia is to blame - may have started off with viral URI at onset but no pulmonary symptoms now.   Really the only complaint he has outside the sweating and weakness is low back pain. Liver function tests are also newly elevated without explanation. No rashes or lymphadenopathy. EBV/CMV serology pending.  On exam he did exhibit some vertebral tenderness to percussion at the level of L2-4. No paraspinal muscle tenderness. Given elevated non-specific inflammatory markers and leukocytosis, may consider checking MRI with and without contrast for reassurance he does not have subacute discitis/septic arthritis of the vertebra. I suspect we can probably stop Cefdinir.    Plan: 1. Follow EBV / CMV serology  2. Consider checking MRI with and without contrast L spine 3. Can stop Cefdinir   Principal Problem:   Fever Active Problems:   DM (diabetes mellitus) ([place])   Benign essential HTN   Hyperlipidemia   Depression   Community acquired pneumonia of left lower lobe of lung   . cefdinir  300 mg Oral Q12H  . FLUoxetine  40 mg Oral Daily  . heparin  5,000 Units  Subcutaneous Q8H  . multivitamin with minerals  1 tablet Oral Daily  . omega-3 acid ethyl esters  1 g Oral BID  . potassium chloride  40 mEq Oral Daily  . simvastatin  40 mg Oral Daily  . vitamin E  400 Units Oral Daily    HPI: Douglas Erickson is a 74 y.o. male admitted from home for evaluation of fevers, sweating and back pain.   States he was in his normal state of "healthy" up until October 17th when he felt like he had the flu. Noticed fatigue, subjective fevers/chills and sweating, back pain with associated leg weakness, cramping muscle pains from knee down and shooting burning pains. He consulted with is PCP at first when these had not improved. He had a rapid COVID test done at first sometime between symptom onset and prior to 10/25. Went for PCR test shortly thereafter and was also negative. He completed Moderna vaccine series in February 2021 and had booster shot along with flu shot recently at the end of September. That "took him out" for a few days but he seemed to bounce back thereafter up until October 17th. He was seen in the ER at [facility] and "didn't have much done for him there" and discharged on his existing antibiotic (azithromycin). He continued to feel poorly, most notably with the back pain complaint. No GI, GU complaints. No CNS complaints. No rashes. May have initially had URI/cough at onset but that has since resolved. Describes this to be  lower back/sacral region. Unable to forward bend to touch toes. No injury to explain. He has been taking Tylenol daily for this which he states "makes him sweaty." Notices sweating cyclically in the late afternoon/early evening.  No dental complaints or recent cleanings/procedures.  Has not lost any weight over this time frame. States he was eating and drinking normally prior to acute illness and has done his best since but feels that he has been dehydrated for weeks.  No travel, no pets or animal exposures. Has not been spending any time  outdoors for any reason. Non-smoker, non-drinker, non-drug user. He is a diabetic but this is diet controlled per his report.    He feels much improved "with the IVs" here in the hospital. Energy and strength are better.    Review of Systems: Review of Systems  Constitutional: Positive for diaphoresis, fever and malaise/fatigue. Negative for weight loss.  HENT: Negative for congestion and sore throat.   Respiratory: Negative for cough, shortness of breath and wheezing.   Cardiovascular: Positive for leg swelling (presently with IVF). Negative for chest pain.  Gastrointestinal: Negative for abdominal pain, constipation, diarrhea, nausea and vomiting.  Genitourinary: Negative for dysuria and flank pain.  Musculoskeletal: Positive for back pain and myalgias (lower legs ). Negative for falls.  Skin: Negative for rash.  Neurological: Positive for weakness. Negative for dizziness, sensory change, focal weakness and headaches.  Psychiatric/Behavioral: Negative for depression and hallucinations. The patient is not nervous/anxious and does not have insomnia.     Past Medical History:  Diagnosis Date  . Diabetes mellitus without complication ([place])   . High cholesterol   . Hypertension   . Kidney disease   . Pneumonia     Social History   Tobacco Use  . Smoking status: Never Smoker  . Smokeless tobacco: Never Used  Substance Use Topics  . Alcohol use: Not Currently  . Drug use: Never    History reviewed. No pertinent family history. No Known Allergies  OBJECTIVE: Blood pressure 136/71, pulse 74, temperature 97.9 F (36.6 C), temperature source Oral, resp. rate 16, height 6' (1.829 m), weight 86.1 kg, SpO2 92 %.  Physical Exam Constitutional:      Appearance: Normal appearance. He is not ill-appearing.  HENT:     Nose: Nose normal.     Mouth/Throat:     Mouth: Mucous membranes are moist.     Pharynx: Oropharynx is clear. No oropharyngeal exudate.     Comments: Caps noted in  mouth. No acute lesions, ulcers or dental abnormalities noted. No pharyngeal erythema or exudate.  Eyes:     Conjunctiva/sclera: Conjunctivae normal.     Pupils: Pupils are equal, round, and reactive to light.  Cardiovascular:     Rate and Rhythm: Normal rate and regular rhythm.     Pulses: Normal pulses.     Heart sounds: No murmur heard.   Pulmonary:     Effort: Pulmonary effort is normal.     Breath sounds: Normal breath sounds.     Comments: No cough demonstrated  Abdominal:     General: Bowel sounds are normal. There is no distension.     Palpations: Abdomen is soft.  Musculoskeletal:        General: Swelling (1+ edema to ankles. ) and tenderness (some tenderness with palpation overlying edematous legs) present.       Back:     Comments: Reports midline back pain. Able to sit up straight, limited forward bend. Intact rotation bilaterally. No significant  tenderness overlying spine but did jump a bit with percussion higher up from where he pointed. No deformity.   Skin:    General: Skin is warm and dry.     Capillary Refill: Capillary refill takes less than 2 seconds.     Findings: No erythema, lesion or rash.  Neurological:     Mental Status: He is alert and oriented to person, place, and time.     Lab Results Lab Results  Component Value Date   WBC 16.1 (H) 08/01/2020   HGB 11.1 (L) 08/01/2020   HCT 32.9 (L) 08/01/2020   MCV 93.7 08/01/2020   PLT 439 (H) 08/01/2020    Lab Results  Component Value Date   CREATININE 1.10 08/01/2020   BUN 20 08/01/2020   NA 136 08/01/2020   K 3.9 08/01/2020   CL 104 08/01/2020   CO2 22 08/01/2020    Lab Results  Component Value Date   ALT 55 (H) 08/01/2020   AST 59 (H) 08/01/2020   ALKPHOS 200 (H) 08/01/2020   BILITOT 0.6 08/01/2020     Microbiology: Recent Results (from the past 240 hour(s))  Culture, blood (routine x 2)     Status: None (Preliminary result)   Collection Time: 07/28/20 12:33 PM   Specimen: BLOOD RIGHT  FOREARM  Result Value Ref Range Status   Specimen Description BLOOD RIGHT FOREARM  Final   Special Requests   Final    BOTTLES DRAWN AEROBIC ONLY Blood Culture results may not be optimal due to an inadequate volume of blood received in culture bottles   Culture   Final    NO GROWTH 4 DAYS Performed at [facility], [place] [address], [place], [place] [address]    Report Status PENDING  Incomplete  Respiratory Panel by RT PCR (Flu A&B, Covid) - Nasopharyngeal Swab     Status: None   Collection Time: 07/28/20 12:48 PM   Specimen: Nasopharyngeal Swab  Result Value Ref Range Status   SARS Coronavirus 2 by RT PCR NEGATIVE NEGATIVE Final    Comment: (NOTE) SARS-CoV-2 target nucleic acids are NOT DETECTED.  The SARS-CoV-2 RNA is generally detectable in upper respiratoy specimens during the acute phase of infection. The lowest concentration of SARS-CoV-2 viral copies this assay can detect is 131 copies/mL. A negative result does not preclude SARS-Cov-2 infection and should not be used as the sole basis for treatment or other patient management decisions. A negative result may occur with  improper specimen collection/handling, submission of specimen other than nasopharyngeal swab, presence of viral mutation(s) within the areas targeted by this assay, and inadequate number of viral copies (<131 copies/mL). A negative result must be combined with clinical observations, patient history, and epidemiological information. The expected result is Negative.  Fact Sheet for Patients:  PinkCheek.be  Fact Sheet for Healthcare Providers:  GravelBags.it  This test is no t yet approved or cleared by the [place] FDA and  has been authorized for detection and/or diagnosis of SARS-CoV-2 by FDA under an Emergency Use Authorization (EUA). This EUA will remain  in effect (meaning this test can be used) for the duration of the COVID-19  declaration under Section 564(b)(1) of the Act, 21 U.S.C. section 360bbb-3(b)(1), unless the authorization is terminated or revoked sooner.     Influenza A by PCR NEGATIVE NEGATIVE Final   Influenza B by PCR NEGATIVE NEGATIVE Final    Comment: (NOTE) The Xpert Xpress SARS-CoV-2/FLU/RSV assay is intended as an aid in  the diagnosis  of influenza from Nasopharyngeal swab specimens and  should not be used as a sole basis for treatment. Nasal washings and  aspirates are unacceptable for Xpert Xpress SARS-CoV-2/FLU/RSV  testing.  Fact Sheet for Patients: PinkCheek.be  Fact Sheet for Healthcare Providers: GravelBags.it  This test is not yet approved or cleared by the [place] FDA and  has been authorized for detection and/or diagnosis of SARS-CoV-2 by  FDA under an Emergency Use Authorization (EUA). This EUA will remain  in effect (meaning this test can be used) for the duration of the  Covid-19 declaration under Section 564(b)(1) of the Act, 21  U.S.C. section 360bbb-3(b)(1), unless the authorization is  terminated or revoked. Performed at [facility], [place] [address], [place], [place] [address]   Culture, blood (routine x 2)     Status: None (Preliminary result)   Collection Time: 07/28/20 12:50 PM   Specimen: BLOOD  Result Value Ref Range Status   Specimen Description BLOOD LEFT ANTECUBITAL  Final   Special Requests   Final    BOTTLES DRAWN AEROBIC AND ANAEROBIC Blood Culture adequate volume   Culture   Final    NO GROWTH 4 DAYS Performed at [facility], [place] [address] [place], [place] [address]    Report Status PENDING  Incomplete  MRSA PCR Screening     Status: None   Collection Time: 07/28/20  9:30 PM   Specimen: Nasopharyngeal  Result Value Ref Range Status   MRSA by PCR NEGATIVE NEGATIVE Final    Comment:        The GeneXpert MRSA Assay (FDA approved for NASAL specimens only), is one  component of a comprehensive MRSA colonization surveillance program. It is not intended to diagnose MRSA infection nor to guide or monitor treatment for MRSA infections. Performed at [facility], [place] [address] [place], [place] [address]     Janene Madeira, MSN, NP-C [facility] [email or web address] Pager: [phone] Office: [phone] [place]: [phone]

## 2020-08-02 LAB — COMPREHENSIVE METABOLIC PANEL
ALT: 68 U/L — ABNORMAL HIGH (ref 0–44)
AST: 73 U/L — ABNORMAL HIGH (ref 15–41)
Albumin: 1.7 g/dL — ABNORMAL LOW (ref 3.5–5.0)
Alkaline Phosphatase: 213 U/L — ABNORMAL HIGH (ref 38–126)
Anion gap: 9 (ref 5–15)
BUN: 19 mg/dL (ref 8–23)
CO2: 23 mmol/L (ref 22–32)
Calcium: 8 mg/dL — ABNORMAL LOW (ref 8.9–10.3)
Chloride: 103 mmol/L (ref 98–111)
Creatinine, Ser: 1.19 mg/dL (ref 0.61–1.24)
GFR, Estimated: >60 mL/min (ref >60)
Glucose, Bld: 118 mg/dL — ABNORMAL HIGH (ref 70–99)
Potassium: 4.2 mmol/L (ref 3.5–5.1)
Sodium: 135 mmol/L (ref 135–145)
Total Bilirubin: 0.8 mg/dL (ref 0.3–1.2)
Total Protein: 5 g/dL — ABNORMAL LOW (ref 6.5–8.1)

## 2020-08-02 LAB — CBC
HCT: 32.6 % — ABNORMAL LOW (ref 39.0–52.0)
Hemoglobin: 11 g/dL — ABNORMAL LOW (ref 13.0–17.0)
MCH: 31.8 pg (ref 26.0–34.0)
MCHC: 33.7 g/dL (ref 30.0–36.0)
MCV: 94.2 fL (ref 80.0–100.0)
Platelets: 464 10*3/uL — ABNORMAL HIGH (ref 150–400)
RBC: 3.46 MIL/uL — ABNORMAL LOW (ref 4.22–5.81)
RDW: 12.6 % (ref 11.5–15.5)
WBC: 17.1 10*3/uL — ABNORMAL HIGH (ref 4.0–10.5)
nRBC: 0 % (ref 0.0–0.2)

## 2020-08-02 LAB — CULTURE, BLOOD (ROUTINE X 2)
Culture: NO GROWTH
Culture: NO GROWTH
Special Requests: ADEQUATE

## 2020-08-02 LAB — PHOSPHORUS: Phosphorus: 3.1 mg/dL (ref 2.5–4.6)

## 2020-08-02 LAB — CMV IGM: CMV IgM: <30 AU/mL (ref 0.0–29.9)

## 2020-08-02 LAB — CMV ANTIBODY, IGG (EIA): CMV Ab - IgG: <0.6 U/mL (ref 0.00–0.59)

## 2020-08-02 LAB — MAGNESIUM: Magnesium: 1.5 mg/dL — ABNORMAL LOW (ref 1.7–2.4)

## 2020-08-02 MED ORDER — MAGNESIUM SULFATE 2 GM/50ML IV SOLN
2.0000 g | Freq: Once | INTRAVENOUS | Status: AC
  Administered 2020-08-02: 2 g via INTRAVENOUS
  Filled 2020-08-02: qty 50

## 2020-08-02 NOTE — Discharge Summary (Signed)
Physician Discharge Summary  Douglas Erickson:[id number] DOB: [birth year] DOA: 07/28/2020  PCP: Patient, No Pcp Per  Admit date: 07/28/2020.  Discharge date: 08/02/2020  Admitted From:  Home. Disposition:  Home   Recommendations for Outpatient Follow-up:  1. Follow up with PCP in 1-2 weeks. 2. Please obtain CMP/CBC in one week. 3. Advised to follow up Infectious Diseases Dr. Linus Salmons in one week. 4.  All the workup for fever has been negative.  Home Health: None. Equipment/Devices: None.  Discharge Condition: Stable CODE STATUS:Full code Diet recommendation: Heart Healthy   Brief Summary / Hospital course: This 74 year old male with PMH of HTN, DM 2, HLD, presented to ED due to the fever, back pain and diaphoresis. He reports intermittent fever since 07/15/2020. He was diagnosed with pneumonia at [facility] ED on 10/27 and was discharged on azithromycin but continued to have fevers and not eating or drinking much. Admitted for pneumonia that failed outpatient antibiotic.However uncertain regarding his pneumonia diagnosis. IV antibiotics transitioned to oral on 11/2 and by afternoon patient again with low-grade fevers of 100.7 F with nausea. ID consulted advised to discontinue antibiotics , this appears viral since all workup so far unremarkable. Patient remained afebrile for 48 hours. Patient has completed course of Z-Pak twice, has received cefdinir for 3 days and received IV antibiotic in hospital for few days. There is mild leukocytosis WBC count increased to 17.0. Patient wants to be discharged home. Patient is being discharged home and advised to follow-up with primary care physician and infectious disease doctor in 1 week.  He was managed for below problems.  Discharge Diagnoses:  Principal Problem:   Fever Active Problems:   DM (diabetes mellitus) ([place])   Benign essential HTN   Hyperlipidemia   Depression   Community acquired pneumonia of left lower lobe of  lung  Fever, unclear etiology: Initially admitted for suspected pneumonia that failed outpatient treatment. However CTA chest on admission showed no evidence of PE and no comment made about consolidation or pneumonia. He had trace bilateral pleural effusions with atelectasis at the lung bases. Also no acute intra-abdominal or pelvic process or lumbar spineprocesson CT abdomenand L-spine. Started empirically on IV Zosyn, vancomycin discontinued. MRSA PCR negative. RSV, flu, COVID-19 PCR negative. Urine microscopy not suggestive of UTI. Blood cultures x 2: Negative to date. Urine pneumococcal antigen negative, Legionella antigen negative. Patient clinically doing well, still had mild neutrophilic leukocytosis. He was switched from broad-spectrum IV antibiotics to cefdinir on 11/2 morning and by the afternoon again developed fever of 100.7 F. Unclear etiology of his ongoing low-grade fevers- ongoing for ~ 2 weeks. He is hemodynamically stable.  Infectious disease consulted,  advised to discontinue antibiotics.  Etiology seems viral since all the work-up has been unremarkable.  Acute kidney injury >>> Improved May be multifactorial due to dehydration on admission, infection and also got contrast.BMP 1.14 on admission which had peaked to 1.36. Improved to 1.27. Encouraged oral fluid intake. Follow BMP. Avoid nephrotoxic's.  Hypokalemia >>> Improved. Replaced. Magnesium normal.  Dehydration with hyponatremia: >>> Resolved Resolved after IV fluids. Discontinued IV fluids  Anemia Possibly dilutional. Also has neutrophilic leukocytosis.  Mild LFT abnormalities Stablewith AST and ALT in the 40s and normal total bilirubin. Unclear etiology,? Related to primary etiology/fever.CMV and EBV negative  Depression Continue Prozac.  Essential hypertension Controlled. HCTZ on hold.  Constipation Large stool burden noted on CT abdomen on admission but patient having  regular BMs since then.  Psoriasis Patient unable to tell the name  of medication that he is taking at home. Stable, not in flare.  Discharge Instructions  Discharge Instructions    Call MD for:  difficulty breathing, headache or visual disturbances   Complete by: As directed    Call MD for:  persistant dizziness or light-headedness   Complete by: As directed    Call MD for:  persistant nausea and vomiting   Complete by: As directed    Call MD for:  temperature >100.4   Complete by: As directed    Diet - low sodium heart healthy   Complete by: As directed    Diet Carb Modified   Complete by: As directed    Discharge instructions   Complete by: As directed    Advised to follow up PCP in one week. Advised to follow up Infectious Diseases Dr. Linus Salmons in one week.   Increase activity slowly   Complete by: As directed      Allergies as of 08/02/2020   No Known Allergies     Medication List    STOP taking these medications   azithromycin 250 MG tablet Commonly known as: ZITHROMAX   cefUROXime 500 MG tablet Commonly known as: CEFTIN     TAKE these medications   APPLE CIDER VINEGAR PLUS PO Take 10 mLs by mouth at bedtime. Mix with water   calcipotriene 0.005 % cream Commonly known as: DOVONOX Apply topically 2 (two) times daily.   calcipotriene-betamethasone external suspension Commonly known as: TACLONEX SCALP Apply 1 application topically 2 (two) times a week.   Fish Oil 1200 MG Caps Take 1,200 mg by mouth in the morning and at bedtime.   FLUoxetine 40 MG capsule Commonly known as: PROZAC Take 40 mg by mouth daily.   Garlic 4540 MG Caps Take 1,000 mg by mouth daily.   hydrochlorothiazide 25 MG tablet Commonly known as: HYDRODIURIL Take 25 mg by mouth daily.   ibuprofen 200 MG tablet Commonly known as: ADVIL Take 200-400 mg by mouth every 6 (six) hours as needed for fever or moderate pain.   multivitamin capsule Take 1 capsule by mouth daily.    simvastatin 40 MG tablet Commonly known as: ZOCOR Take 40 mg by mouth daily.   vitamin C 1000 MG tablet Take 500 mg by mouth daily.   vitamin E 180 MG (400 UNITS) capsule Generic drug: vitamin E Take 400 Units by mouth daily.       Follow-up Information    Comer, Okey Regal, MD Follow up in 1 week(s).   Specialty: Infectious Diseases Contact information: [address] [address] [phone]              No Known Allergies  Consultations:  Infectious Diseases.   Procedures/Studies: DG Chest 2 View  Result Date: 07/28/2020 CLINICAL DATA:  74 year old male with a history of pneumonia EXAM: CHEST - 2 VIEW COMPARISON:  07/25/2020 FINDINGS: Cardiomediastinal silhouette unchanged in size and contour. No pneumothorax. No pleural effusion. The vague opacity at the base of the left lung is favored to be persisting on the current plain film, slightly different appearance at the diaphragm given the varying angulation of the acquisition. No new confluent airspace disease. No interlobular septal thickening. No displaced fracture.  Degenerative changes of the spine. IMPRESSION: Similar appearance of the lungs to the prior plain film, with vague nodular opacity at the base of the left lung overlying the diaphragm, potentially atelectasis/scarring or small focus of pneumonia. Otherwise no new airspace disease or other acute  changes. As was previously stated, follow-up PA and lateral chest x-ray recommended in 4-6 weeks. Electronically Signed   By: Corrie Mckusick D.O.   On: 07/28/2020 11:46   CT Angio Chest PE W/Cm &/Or Wo Cm  Result Date: 07/28/2020 CLINICAL DATA:  Sepsis. Possible GI source. Back pain and fever with diaphoresis. EXAM: CT ANGIOGRAPHY CHEST CT ANGIOGRAPHY ABDOMEN AND PELVIS WITH CONTRAST CT LUMBAR SPINE WITHOUT CONTRAST TECHNIQUE: Multidetector CT imaging of the chest was performed using the standard protocol during bolus administration of intravenous  contrast. Multiplanar CT image reconstructions and MIPs were obtained to evaluate the vascular anatomy. Multidetector CT imaging of the abdomen and pelvis was performed using the standard protocol during bolus administration of intravenous contrast. Multiplanar CT images of the lumbar spine was reconstructed from contemporary CT of the Chest, Abdomen, and Pelvis CONTRAST:  155mL OMNIPAQUE IOHEXOL 350 MG/ML SOLN COMPARISON:  None. FINDINGS: CTA CHEST FINDINGS Cardiovascular: Contrast injection is sufficient to demonstrate satisfactory opacification of the pulmonary arteries to the segmental level. There is no pulmonary embolus or evidence of right heart strain. The size of the main pulmonary artery is normal. Heart size is normal. Coronary artery calcifications are noted. The course and caliber of the aorta are normal. There is no atherosclerotic calcification. Opacification decreased due to pulmonary arterial phase contrast bolus timing. Mediastinum/Nodes: -- No mediastinal lymphadenopathy. -- No hilar lymphadenopathy. -- No axillary lymphadenopathy. -- No supraclavicular lymphadenopathy. -- Normal thyroid gland where visualized. -  Unremarkable esophagus. Lungs/Pleura: There is atelectasis at the lung bases. There are trace bilateral pleural effusions. There is no pneumothorax. Musculoskeletal: No chest wall abnormality. No bony spinal canal stenosis. Review of the MIP images confirms the above findings. CT ABDOMEN and PELVIS FINDINGS Hepatobiliary: The liver is normal. Normal gallbladder.There is no biliary ductal dilation. Pancreas: Normal contours without ductal dilatation. No peripancreatic fluid collection. Spleen: Unremarkable. Adrenals/Urinary Tract: --Adrenal glands: Unremarkable. --Right kidney/ureter: No hydronephrosis or radiopaque kidney stones. --Left kidney/ureter: No hydronephrosis or radiopaque kidney stones. --Urinary bladder: Unremarkable. Stomach/Bowel: --Stomach/Duodenum: No hiatal hernia or  other gastric abnormality. Normal duodenal course and caliber. --Small bowel: Unremarkable. --Colon: There is a large amount of stool in the colon. --Appendix: Normal. Vascular/Lymphatic: Atherosclerotic calcification is present within the non-aneurysmal abdominal aorta, without hemodynamically significant stenosis. --No retroperitoneal lymphadenopathy. --No mesenteric lymphadenopathy. --No pelvic or inguinal lymphadenopathy. Reproductive: The prostate gland is enlarged. Other: There is a small amount of free fluid in the patient's pelvis of unknown clinical significance. The abdominal wall is normal. Musculoskeletal. No acute displaced fractures. There is mild-to-moderate disc height loss at the L5-S1 level. The remaining disc heights are relatively well preserved. Review of the MIP images confirms the above findings. IMPRESSION: 1. No evidence of pulmonary embolus. 2. Trace bilateral pleural effusions with atelectasis at the lung bases. 3. No acute intra-abdominal or pelvic process. 4. No acute abnormality identified involving the lumbar spine. 5. Large amount of stool in the colon. 6. Prostatomegaly. 7. Small amount of free fluid in the patient's pelvis of unknown clinical significance. Aortic Atherosclerosis (ICD10-I70.0). Electronically Signed   By: Constance Holster M.D.   On: 07/28/2020 16:24   CT L-SPINE NO CHARGE  Result Date: 07/28/2020 CLINICAL DATA:  Sepsis. Possible GI source. Back pain and fever with diaphoresis. EXAM: CT ANGIOGRAPHY CHEST CT ANGIOGRAPHY ABDOMEN AND PELVIS WITH CONTRAST CT LUMBAR SPINE WITHOUT CONTRAST TECHNIQUE: Multidetector CT imaging of the chest was performed using the standard protocol during bolus administration of intravenous contrast. Multiplanar CT image reconstructions and MIPs  were obtained to evaluate the vascular anatomy. Multidetector CT imaging of the abdomen and pelvis was performed using the standard protocol during bolus administration of intravenous contrast.  Multiplanar CT images of the lumbar spine was reconstructed from contemporary CT of the Chest, Abdomen, and Pelvis CONTRAST:  155mL OMNIPAQUE IOHEXOL 350 MG/ML SOLN COMPARISON:  None. FINDINGS: CTA CHEST FINDINGS Cardiovascular: Contrast injection is sufficient to demonstrate satisfactory opacification of the pulmonary arteries to the segmental level. There is no pulmonary embolus or evidence of right heart strain. The size of the main pulmonary artery is normal. Heart size is normal. Coronary artery calcifications are noted. The course and caliber of the aorta are normal. There is no atherosclerotic calcification. Opacification decreased due to pulmonary arterial phase contrast bolus timing. Mediastinum/Nodes: -- No mediastinal lymphadenopathy. -- No hilar lymphadenopathy. -- No axillary lymphadenopathy. -- No supraclavicular lymphadenopathy. -- Normal thyroid gland where visualized. -  Unremarkable esophagus. Lungs/Pleura: There is atelectasis at the lung bases. There are trace bilateral pleural effusions. There is no pneumothorax. Musculoskeletal: No chest wall abnormality. No bony spinal canal stenosis. Review of the MIP images confirms the above findings. CT ABDOMEN and PELVIS FINDINGS Hepatobiliary: The liver is normal. Normal gallbladder.There is no biliary ductal dilation. Pancreas: Normal contours without ductal dilatation. No peripancreatic fluid collection. Spleen: Unremarkable. Adrenals/Urinary Tract: --Adrenal glands: Unremarkable. --Right kidney/ureter: No hydronephrosis or radiopaque kidney stones. --Left kidney/ureter: No hydronephrosis or radiopaque kidney stones. --Urinary bladder: Unremarkable. Stomach/Bowel: --Stomach/Duodenum: No hiatal hernia or other gastric abnormality. Normal duodenal course and caliber. --Small bowel: Unremarkable. --Colon: There is a large amount of stool in the colon. --Appendix: Normal. Vascular/Lymphatic: Atherosclerotic calcification is present within the  non-aneurysmal abdominal aorta, without hemodynamically significant stenosis. --No retroperitoneal lymphadenopathy. --No mesenteric lymphadenopathy. --No pelvic or inguinal lymphadenopathy. Reproductive: The prostate gland is enlarged. Other: There is a small amount of free fluid in the patient's pelvis of unknown clinical significance. The abdominal wall is normal. Musculoskeletal. No acute displaced fractures. There is mild-to-moderate disc height loss at the L5-S1 level. The remaining disc heights are relatively well preserved. Review of the MIP images confirms the above findings. IMPRESSION: 1. No evidence of pulmonary embolus. 2. Trace bilateral pleural effusions with atelectasis at the lung bases. 3. No acute intra-abdominal or pelvic process. 4. No acute abnormality identified involving the lumbar spine. 5. Large amount of stool in the colon. 6. Prostatomegaly. 7. Small amount of free fluid in the patient's pelvis of unknown clinical significance. Aortic Atherosclerosis (ICD10-I70.0). Electronically Signed   By: Constance Holster M.D.   On: 07/28/2020 16:24   ECHOCARDIOGRAM COMPLETE  Result Date: 07/29/2020    ECHOCARDIOGRAM REPORT   Patient Name:   Douglas Erickson Date of Exam: 07/29/2020 Medical Rec #:  [id number]    Height:       72.0 in Accession #:    [id number]   Weight:       173.1 lb Date of Birth:  1946/08/19   BSA:          2.004 m Patient Age:    39 years     BP:           147/92 mmHg Patient Gender: M            HR:           80 bpm. Exam Location:  Inpatient Procedure: 2D Echo, Cardiac Doppler and Color Doppler Indications:    Fever  History:        Patient has no prior history  of Echocardiogram examinations.                 Risk Factors:Hypertension, Diabetes and Dyslipidemia.  Sonographer:    Alvino Chapel RCS Referring Phys: [place]  1. Left ventricular ejection fraction, by estimation, is 60 to 65%. The left ventricle has normal function. The left ventricle has no  regional wall motion abnormalities. Left ventricular diastolic parameters are indeterminate. Elevated left ventricular end-diastolic pressure.  2. Right ventricular systolic function is normal. The right ventricular size is normal. Tricuspid regurgitation signal is inadequate for assessing PA pressure.  3. The mitral valve is normal in structure. Mild mitral valve regurgitation. No evidence of mitral stenosis.  4. The aortic valve is tricuspid. Aortic valve regurgitation is not visualized. Mild aortic valve sclerosis is present, with no evidence of aortic valve stenosis.  5. The inferior vena cava is normal in size with greater than 50% respiratory variability, suggesting right atrial pressure of 3 mmHg. FINDINGS  Left Ventricle: Left ventricular ejection fraction, by estimation, is 60 to 65%. The left ventricle has normal function. The left ventricle has no regional wall motion abnormalities. The left ventricular internal cavity size was normal in size. There is  no left ventricular hypertrophy. Left ventricular diastolic parameters are indeterminate. Elevated left ventricular end-diastolic pressure. Right Ventricle: The right ventricular size is normal. No increase in right ventricular wall thickness. Right ventricular systolic function is normal. Tricuspid regurgitation signal is inadequate for assessing PA pressure. Left Atrium: Left atrial size was normal in size. Right Atrium: Right atrial size was normal in size. Pericardium: There is no evidence of pericardial effusion. Mitral Valve: The mitral valve is normal in structure. Mild mitral valve regurgitation. No evidence of mitral valve stenosis. Tricuspid Valve: The tricuspid valve is normal in structure. Tricuspid valve regurgitation is not demonstrated. No evidence of tricuspid stenosis. Aortic Valve: The aortic valve is tricuspid. Aortic valve regurgitation is not visualized. Mild aortic valve sclerosis is present, with no evidence of aortic valve stenosis.  Pulmonic Valve: The pulmonic valve was normal in structure. Pulmonic valve regurgitation is not visualized. No evidence of pulmonic stenosis. Aorta: The aortic root is normal in size and structure. Venous: The inferior vena cava is normal in size with greater than 50% respiratory variability, suggesting right atrial pressure of 3 mmHg. IAS/Shunts: No atrial level shunt detected by color flow Doppler.  LEFT VENTRICLE PLAX 2D LVIDd:         4.10 cm  Diastology LVIDs:         2.20 cm  LV e' medial:    6.53 cm/s LV PW:         1.00 cm  LV E/e' medial:  16.2 LV IVS:        1.10 cm  LV e' lateral:   8.16 cm/s LVOT diam:     1.80 cm  LV E/e' lateral: 13.0 LV SV:         60 LV SV Index:   30 LVOT Area:     2.54 cm  RIGHT VENTRICLE RV S prime:     13.70 cm/s TAPSE (M-mode): 2.5 cm LEFT ATRIUM             Index       RIGHT ATRIUM           Index LA diam:        3.30 cm 1.65 cm/m  RA Area:     14.70 cm LA Vol (A2C):   73.3  ml 36.59 ml/m RA Volume:   35.90 ml  17.92 ml/m LA Vol (A4C):   59.3 ml 29.60 ml/m LA Biplane Vol: 66.5 ml 33.19 ml/m  AORTIC VALVE LVOT Vmax:   129.00 cm/s LVOT Vmean:  79.400 cm/s LVOT VTI:    0.237 m  AORTA Ao Root diam: 3.10 cm MITRAL VALVE MV Area (PHT): 3.17 cm     SHUNTS MV Decel Time: 239 msec     Systemic VTI:  0.24 m MV E velocity: 106.00 cm/s  Systemic Diam: 1.80 cm MV A velocity: 85.60 cm/s MV E/A ratio:  1.24 Fransico Him MD Electronically signed by Fransico Him MD Signature Date/Time: 07/29/2020/2:18:57 PM    Final    CT Angio Abd/Pel w/ and/or w/o  Result Date: 07/28/2020 CLINICAL DATA:  Sepsis. Possible GI source. Back pain and fever with diaphoresis. EXAM: CT ANGIOGRAPHY CHEST CT ANGIOGRAPHY ABDOMEN AND PELVIS WITH CONTRAST CT LUMBAR SPINE WITHOUT CONTRAST TECHNIQUE: Multidetector CT imaging of the chest was performed using the standard protocol during bolus administration of intravenous contrast. Multiplanar CT image reconstructions and MIPs were obtained to evaluate the  vascular anatomy. Multidetector CT imaging of the abdomen and pelvis was performed using the standard protocol during bolus administration of intravenous contrast. Multiplanar CT images of the lumbar spine was reconstructed from contemporary CT of the Chest, Abdomen, and Pelvis CONTRAST:  178mL OMNIPAQUE IOHEXOL 350 MG/ML SOLN COMPARISON:  None. FINDINGS: CTA CHEST FINDINGS Cardiovascular: Contrast injection is sufficient to demonstrate satisfactory opacification of the pulmonary arteries to the segmental level. There is no pulmonary embolus or evidence of right heart strain. The size of the main pulmonary artery is normal. Heart size is normal. Coronary artery calcifications are noted. The course and caliber of the aorta are normal. There is no atherosclerotic calcification. Opacification decreased due to pulmonary arterial phase contrast bolus timing. Mediastinum/Nodes: -- No mediastinal lymphadenopathy. -- No hilar lymphadenopathy. -- No axillary lymphadenopathy. -- No supraclavicular lymphadenopathy. -- Normal thyroid gland where visualized. -  Unremarkable esophagus. Lungs/Pleura: There is atelectasis at the lung bases. There are trace bilateral pleural effusions. There is no pneumothorax. Musculoskeletal: No chest wall abnormality. No bony spinal canal stenosis. Review of the MIP images confirms the above findings. CT ABDOMEN and PELVIS FINDINGS Hepatobiliary: The liver is normal. Normal gallbladder.There is no biliary ductal dilation. Pancreas: Normal contours without ductal dilatation. No peripancreatic fluid collection. Spleen: Unremarkable. Adrenals/Urinary Tract: --Adrenal glands: Unremarkable. --Right kidney/ureter: No hydronephrosis or radiopaque kidney stones. --Left kidney/ureter: No hydronephrosis or radiopaque kidney stones. --Urinary bladder: Unremarkable. Stomach/Bowel: --Stomach/Duodenum: No hiatal hernia or other gastric abnormality. Normal duodenal course and caliber. --Small bowel:  Unremarkable. --Colon: There is a large amount of stool in the colon. --Appendix: Normal. Vascular/Lymphatic: Atherosclerotic calcification is present within the non-aneurysmal abdominal aorta, without hemodynamically significant stenosis. --No retroperitoneal lymphadenopathy. --No mesenteric lymphadenopathy. --No pelvic or inguinal lymphadenopathy. Reproductive: The prostate gland is enlarged. Other: There is a small amount of free fluid in the patient's pelvis of unknown clinical significance. The abdominal wall is normal. Musculoskeletal. No acute displaced fractures. There is mild-to-moderate disc height loss at the L5-S1 level. The remaining disc heights are relatively well preserved. Review of the MIP images confirms the above findings. IMPRESSION: 1. No evidence of pulmonary embolus. 2. Trace bilateral pleural effusions with atelectasis at the lung bases. 3. No acute intra-abdominal or pelvic process. 4. No acute abnormality identified involving the lumbar spine. 5. Large amount of stool in the colon. 6. Prostatomegaly. 7. Small amount of free fluid  in the patient's pelvis of unknown clinical significance. Aortic Atherosclerosis (ICD10-I70.0). Electronically Signed   By: Constance Holster M.D.   On: 07/28/2020 16:24       Subjective: Patient was seen and examined at bed side, He reports feeling better, denies any fever, chills, chest pain, shortness of breath and wants to be discharged home.  Discharge Exam: Vitals:   08/02/20 0438 08/02/20 0840  BP: 133/71 127/69  Pulse: 72 70  Resp: 18 18  Temp: 98.1 F (36.7 C) 98.6 F (37 C)  SpO2: 96% 96%   Vitals:   08/01/20 2017 08/02/20 0438 08/02/20 0500 08/02/20 0840  BP: 140/73 133/71  127/69  Pulse: 78 72  70  Resp: 18 18  18   Temp: 98.5 F (36.9 C) 98.1 F (36.7 C)  98.6 F (37 C)  TempSrc: Oral Oral  Oral  SpO2: 96% 96%  96%  Weight:   86.1 kg   Height:        General: Pt is alert, awake, not in acute distress Cardiovascular:  RRR, S1/S2 +, no rubs, no gallops Respiratory: CTA bilaterally, no wheezing, no rhonchi Abdominal: Soft, NT, ND, bowel sounds + Extremities: no edema, no cyanosis    The results of significant diagnostics from this hospitalization (including imaging, microbiology, ancillary and laboratory) are listed below for reference.     Microbiology: Recent Results (from the past 240 hour(s))  Culture, blood (routine x 2)     Status: None   Collection Time: 07/28/20 12:33 PM   Specimen: BLOOD RIGHT FOREARM  Result Value Ref Range Status   Specimen Description BLOOD RIGHT FOREARM  Final   Special Requests   Final    BOTTLES DRAWN AEROBIC ONLY Blood Culture results may not be optimal due to an inadequate volume of blood received in culture bottles   Culture   Final    NO GROWTH 5 DAYS Performed at [facility], [place] [address], [place] [place], [place] [address]    Report Status 08/02/2020 FINAL  Final  Respiratory Panel by RT PCR (Flu A&B, Covid) - Nasopharyngeal Swab     Status: None   Collection Time: 07/28/20 12:48 PM   Specimen: Nasopharyngeal Swab  Result Value Ref Range Status   SARS Coronavirus 2 by RT PCR NEGATIVE NEGATIVE Final    Comment: (NOTE) SARS-CoV-2 target nucleic acids are NOT DETECTED.  The SARS-CoV-2 RNA is generally detectable in upper respiratoy specimens during the acute phase of infection. The lowest concentration of SARS-CoV-2 viral copies this assay can detect is 131 copies/mL. A negative result does not preclude SARS-Cov-2 infection and should not be used as the sole basis for treatment or other patient management decisions. A negative result may occur with  improper specimen collection/handling, submission of specimen other than nasopharyngeal swab, presence of viral mutation(s) within the areas targeted by this assay, and inadequate number of viral copies (<131 copies/mL). A negative result must be combined with clinical observations, patient history, and  epidemiological information. The expected result is Negative.  Fact Sheet for Patients:  PinkCheek.be  Fact Sheet for Healthcare Providers:  GravelBags.it  This test is no t yet approved or cleared by the [place] FDA and  has been authorized for detection and/or diagnosis of SARS-CoV-2 by FDA under an Emergency Use Authorization (EUA). This EUA will remain  in effect (meaning this test can be used) for the duration of the COVID-19 declaration under Section 564(b)(1) of the Act, 21 U.S.C. section 360bbb-3(b)(1), unless the authorization is  terminated or revoked sooner.     Influenza A by PCR NEGATIVE NEGATIVE Final   Influenza B by PCR NEGATIVE NEGATIVE Final    Comment: (NOTE) The Xpert Xpress SARS-CoV-2/FLU/RSV assay is intended as an aid in  the diagnosis of influenza from Nasopharyngeal swab specimens and  should not be used as a sole basis for treatment. Nasal washings and  aspirates are unacceptable for Xpert Xpress SARS-CoV-2/FLU/RSV  testing.  Fact Sheet for Patients: PinkCheek.be  Fact Sheet for Healthcare Providers: GravelBags.it  This test is not yet approved or cleared by the [place] FDA and  has been authorized for detection and/or diagnosis of SARS-CoV-2 by  FDA under an Emergency Use Authorization (EUA). This EUA will remain  in effect (meaning this test can be used) for the duration of the  Covid-19 declaration under Section 564(b)(1) of the Act, 21  U.S.C. section 360bbb-3(b)(1), unless the authorization is  terminated or revoked. Performed at [facility], [place] [address], [place], [place] [address]   Culture, blood (routine x 2)     Status: None   Collection Time: 07/28/20 12:50 PM   Specimen: BLOOD  Result Value Ref Range Status   Specimen Description BLOOD LEFT ANTECUBITAL  Final   Special Requests   Final    BOTTLES  DRAWN AEROBIC AND ANAEROBIC Blood Culture adequate volume   Culture   Final    NO GROWTH 5 DAYS Performed at [facility], [place] [address] [place], [place] [address]    Report Status 08/02/2020 FINAL  Final  MRSA PCR Screening     Status: None   Collection Time: 07/28/20  9:30 PM   Specimen: Nasopharyngeal  Result Value Ref Range Status   MRSA by PCR NEGATIVE NEGATIVE Final    Comment:        The GeneXpert MRSA Assay (FDA approved for NASAL specimens only), is one component of a comprehensive MRSA colonization surveillance program. It is not intended to diagnose MRSA infection nor to guide or monitor treatment for MRSA infections. Performed at [facility], [place] [address] [place], [place] [address]      Labs: BNP (last 3 results) No results for input(s): BNP in the last 8760 hours. Basic Metabolic Panel: Recent Labs  Lab 07/29/20 0658 07/30/20 0327 07/31/20 0115 08/01/20 0146 08/02/20 0147  NA 134* 136 136 136 135  K 3.2* 3.8 3.8 3.9 4.2  CL 96* 103 103 104 103  CO2 23 23 21* 22 23  GLUCOSE 153* 136* 115* 106* 118*  BUN 24* 24* 24* 20 19  CREATININE 1.36* 1.35* 1.27* 1.10 1.19  CALCIUM 8.7* 8.1* 8.2* 8.2* 8.0*  MG 1.8 1.8  --   --  1.5*  PHOS 3.7  --   --   --  3.1   Liver Function Tests: Recent Labs  Lab 07/29/20 0658 07/30/20 0327 07/31/20 0115 08/01/20 0146 08/02/20 0147  AST 47* 46* 47* 59* 73*  ALT 52* 47* 46* 55* 68*  ALKPHOS 154* 140* 160* 200* 213*  BILITOT 0.6 0.8 0.6 0.6 0.8  PROT 6.3* 5.2* 5.0* 5.1* 5.0*  ALBUMIN 2.2* 1.9* 1.7* 1.7* 1.7*   Recent Labs  Lab 07/28/20 1438  LIPASE 20   No results for input(s): AMMONIA in the last 168 hours. CBC: Recent Labs  Lab 07/28/20 1114 07/28/20 2105 07/29/20 0658 07/30/20 0327 07/31/20 0115 08/01/20 0146 08/02/20 0147  WBC 17.2*   < > 16.8* 13.3* 14.8* 16.1* 17.1*  NEUTROABS 14.6*  --  12.9* 10.2* 11.0* 12.7*  --   HGB 13.8   < > 13.8 11.6* 11.7* 11.1* 11.0*  HCT 40.0   < >  40.4 34.9* 35.6* 32.9* 32.6*  MCV 91.1   < > 92.0 93.1 94.7 93.7 94.2  PLT 515*   < > 526* 417* 431* 439* 464*   < > = values in this interval not displayed.   Cardiac Enzymes: No results for input(s): CKTOTAL, CKMB, CKMBINDEX, TROPONINI in the last 168 hours. BNP: Invalid input(s): POCBNP CBG: No results for input(s): GLUCAP in the last 168 hours. D-Dimer No results for input(s): DDIMER in the last 72 hours. Hgb A1c No results for input(s): HGBA1C in the last 72 hours. Lipid Profile No results for input(s): CHOL, HDL, LDLCALC, TRIG, CHOLHDL, LDLDIRECT in the last 72 hours. Thyroid function studies No results for input(s): TSH, T4TOTAL, T3FREE, THYROIDAB in the last 72 hours.  Invalid input(s): FREET3 Anemia work up No results for input(s): VITAMINB12, FOLATE, FERRITIN, TIBC, IRON, RETICCTPCT in the last 72 hours. Urinalysis    Component Value Date/Time   COLORURINE YELLOW 07/28/2020 1247   APPEARANCEUR HAZY (A) 07/28/2020 1247   LABSPEC 1.024 07/28/2020 1247   PHURINE 5.0 07/28/2020 1247   GLUCOSEU NEGATIVE 07/28/2020 1247   HGBUR NEGATIVE 07/28/2020 [place] 07/28/2020 [place] 07/28/2020 1247   PROTEINUR 30 (A) 07/28/2020 1247   NITRITE NEGATIVE 07/28/2020 1247   LEUKOCYTESUR NEGATIVE 07/28/2020 1247   Sepsis Labs Invalid input(s): PROCALCITONIN,  WBC,  LACTICIDVEN Microbiology Recent Results (from the past 240 hour(s))  Culture, blood (routine x 2)     Status: None   Collection Time: 07/28/20 12:33 PM   Specimen: BLOOD RIGHT FOREARM  Result Value Ref Range Status   Specimen Description BLOOD RIGHT FOREARM  Final   Special Requests   Final    BOTTLES DRAWN AEROBIC ONLY Blood Culture results may not be optimal due to an inadequate volume of blood received in culture bottles   Culture   Final    NO GROWTH 5 DAYS Performed at [facility], [place] [address], [place], [place] [address]    Report Status 08/02/2020 FINAL  Final   Respiratory Panel by RT PCR (Flu A&B, Covid) - Nasopharyngeal Swab     Status: None   Collection Time: 07/28/20 12:48 PM   Specimen: Nasopharyngeal Swab  Result Value Ref Range Status   SARS Coronavirus 2 by RT PCR NEGATIVE NEGATIVE Final    Comment: (NOTE) SARS-CoV-2 target nucleic acids are NOT DETECTED.  The SARS-CoV-2 RNA is generally detectable in upper respiratoy specimens during the acute phase of infection. The lowest concentration of SARS-CoV-2 viral copies this assay can detect is 131 copies/mL. A negative result does not preclude SARS-Cov-2 infection and should not be used as the sole basis for treatment or other patient management decisions. A negative result may occur with  improper specimen collection/handling, submission of specimen other than nasopharyngeal swab, presence of viral mutation(s) within the areas targeted by this assay, and inadequate number of viral copies (<131 copies/mL). A negative result must be combined with clinical observations, patient history, and epidemiological information. The expected result is Negative.  Fact Sheet for Patients:  PinkCheek.be  Fact Sheet for Healthcare Providers:  GravelBags.it  This test is no t yet approved or cleared by the [place] FDA and  has been authorized for detection and/or diagnosis of SARS-CoV-2 by FDA under an Emergency Use Authorization (EUA). This EUA will  remain  in effect (meaning this test can be used) for the duration of the COVID-19 declaration under Section 564(b)(1) of the Act, 21 U.S.C. section 360bbb-3(b)(1), unless the authorization is terminated or revoked sooner.     Influenza A by PCR NEGATIVE NEGATIVE Final   Influenza B by PCR NEGATIVE NEGATIVE Final    Comment: (NOTE) The Xpert Xpress SARS-CoV-2/FLU/RSV assay is intended as an aid in  the diagnosis of influenza from Nasopharyngeal swab specimens and  should not be used as  a sole basis for treatment. Nasal washings and  aspirates are unacceptable for Xpert Xpress SARS-CoV-2/FLU/RSV  testing.  Fact Sheet for Patients: PinkCheek.be  Fact Sheet for Healthcare Providers: GravelBags.it  This test is not yet approved or cleared by the [place] FDA and  has been authorized for detection and/or diagnosis of SARS-CoV-2 by  FDA under an Emergency Use Authorization (EUA). This EUA will remain  in effect (meaning this test can be used) for the duration of the  Covid-19 declaration under Section 564(b)(1) of the Act, 21  U.S.C. section 360bbb-3(b)(1), unless the authorization is  terminated or revoked. Performed at [facility], [place] [address], [place], [place] [address]   Culture, blood (routine x 2)     Status: None   Collection Time: 07/28/20 12:50 PM   Specimen: BLOOD  Result Value Ref Range Status   Specimen Description BLOOD LEFT ANTECUBITAL  Final   Special Requests   Final    BOTTLES DRAWN AEROBIC AND ANAEROBIC Blood Culture adequate volume   Culture   Final    NO GROWTH 5 DAYS Performed at [facility], [place] [address] [place], [place] [address]    Report Status 08/02/2020 FINAL  Final  MRSA PCR Screening     Status: None   Collection Time: 07/28/20  9:30 PM   Specimen: Nasopharyngeal  Result Value Ref Range Status   MRSA by PCR NEGATIVE NEGATIVE Final    Comment:        The GeneXpert MRSA Assay (FDA approved for NASAL specimens only), is one component of a comprehensive MRSA colonization surveillance program. It is not intended to diagnose MRSA infection nor to guide or monitor treatment for MRSA infections. Performed at [facility], [place] [address], [place],  [address]      Time coordinating discharge: Over 30 minutes  SIGNED:   Shawna Clamp, MD  [facility] 08/02/2020, 10:59 AM Pager   If 7PM-7AM, please contact  night-coverage [email or web address]

## 2020-08-02 NOTE — Progress Notes (Signed)
DISCHARGE NOTE HOME Douglas Erickson to be discharged Home per MD order. Discussed prescriptions and follow up appointments with the patient. Prescriptions given to patient; medication list explained in detail. Patient verbalized understanding.  Skin clean, dry and intact without evidence of skin break down, no evidence of skin tears noted. IV catheter discontinued intact. Site without signs and symptoms of complications. Dressing and pressure applied. Pt denies pain at the site currently. No complaints noted.  Patient free of lines, drains, and wounds.   An After Visit Summary (AVS) was printed and given to the patient. Patient escorted via wheelchair, and discharged home via private auto.  Orville Govern, RN

## 2020-08-02 NOTE — Discharge Instructions (Signed)
Advised to follow up PCP in one week. Advised to follow up Infectious Diseases Dr. Linus Salmons in one week.

## 2020-08-06 ENCOUNTER — Telehealth (INDEPENDENT_AMBULATORY_CARE_PROVIDER_SITE_OTHER): Payer: Medicare Other | Admitting: Internal Medicine

## 2020-08-06 ENCOUNTER — Encounter: Payer: Self-pay | Admitting: Internal Medicine

## 2020-08-06 ENCOUNTER — Other Ambulatory Visit: Payer: Self-pay

## 2020-08-06 DIAGNOSIS — D72829 Elevated white blood cell count, unspecified: Secondary | ICD-10-CM | POA: Insufficient documentation

## 2020-08-06 DIAGNOSIS — R509 Fever, unspecified: Secondary | ICD-10-CM | POA: Diagnosis not present

## 2020-08-06 DIAGNOSIS — R7401 Elevation of levels of liver transaminase levels: Secondary | ICD-10-CM | POA: Insufficient documentation

## 2020-08-06 DIAGNOSIS — J189 Pneumonia, unspecified organism: Secondary | ICD-10-CM | POA: Diagnosis not present

## 2020-08-06 DIAGNOSIS — M549 Dorsalgia, unspecified: Secondary | ICD-10-CM | POA: Diagnosis not present

## 2020-08-06 NOTE — Progress Notes (Signed)
[place] for Infectious Disease  Reason for Consult: fevers Referring Provider: [facility]  HPI:  Douglas Erickson is a 74 y.o. male who presents to clinic for hospital follow up of fevers of unknown origin.     Pt was recently hospitalized at [facility] from 07/28/20-08/02/20 when he was admitted from home for further evaluation of fevers, sweats, and back pain.  Per chart review and in discussing with patient, he was in his usual state of health until approximately 07/15/20 when he felt symptoms c/w flu-like illness.  Sx included fatigue, subjective fevers, chills and sweats.  Also reported back pain and muscle cramping. A rapid COVID test was done sometime about 1 week into symptoms and was negative.  Follow up PCR test was also negative (Pt received Moderna Vaccine in Feb 2021 plus booster and flu vax end of Sept 2021).  Seen in the ED at [facility] on 10/27 and was discharged on existing antibiotic (Azithromycin).  CXR there showed hazy left costophrenic angle opacity.  Continued to feel poorly including back pain being most significant.  No GI or urinary complaints, no rashes, no neurologic symptoms.  He was admitted to the hospital as noted above on 07/28/20 and started on IV antibiotics and given fluid hydration.  His symptoms improved with this treatment. Blood cultures were no growth. CT scans are all reassuring without any foci of bacterial infection, malignancy or lymphadenopathy.  CMV and EBV serology was negative.  His exam in the hospital was notable for some tenderness at L2-4.  He did have elevated CRP (non-specific) and leukocytosis.  MRI was suggested to be considered but not pursued in the hospital and his back pain today is improved (CT lumbar spine also did not show acute process).  He notably had elevated WBC of 17 at discharge and AST/ALT of 73/68, alk phos 213.  He has an appt with his PCP later this week to recheck these values.    Today, he feels improved  since leaving the hospital.  As mentioned he has no further back pain, his leg swelling is better and his energy has improved.  He has no active respiratory symptoms.  His wife reports a one time fever last evening of about 101 after he showered and exerted himself that resolved without intervention.    Patient's Medications  New Prescriptions   No medications on file  Previous Medications   APPLE CID VN-GRN TEA-BIT OR-CR (APPLE CIDER VINEGAR PLUS PO)    Take 10 mLs by mouth at bedtime. Mix with water   ASCORBIC ACID (VITAMIN C) 1000 MG TABLET    Take 500 mg by mouth daily.   CALCIPOTRIENE (DOVONOX) 0.005 % CREAM    Apply topically 2 (two) times daily.   CALCIPOTRIENE-BETAMETHASONE (TACLONEX SCALP) EXTERNAL SUSPENSION    Apply 1 application topically 2 (two) times a week.    FLUOXETINE (PROZAC) 40 MG CAPSULE    Take 40 mg by mouth daily.   GARLIC 6295 MG CAPS    Take 1,000 mg by mouth daily.   HYDROCHLOROTHIAZIDE (HYDRODIURIL) 25 MG TABLET    Take 25 mg by mouth daily.   IBUPROFEN (ADVIL) 200 MG TABLET    Take 200-400 mg by mouth every 6 (six) hours as needed for fever or moderate pain.   MULTIPLE VITAMIN (MULTIVITAMIN) CAPSULE    Take 1 capsule by mouth daily.   OMEGA-3 FATTY ACIDS (FISH OIL) 1200 MG CAPS    Take 1,200 mg  by mouth in the morning and at bedtime.   SIMVASTATIN (ZOCOR) 40 MG TABLET    Take 40 mg by mouth daily.   VITAMIN E (VITAMIN E) 180 MG (400 UNITS) CAPSULE    Take 400 Units by mouth daily.  Modified Medications   No medications on file  Discontinued Medications   No medications on file      Past Medical History:  Diagnosis Date  . Diabetes mellitus without complication ([place])   . High cholesterol   . Hypertension   . Kidney disease   . Pneumonia     Social History   Tobacco Use  . Smoking status: Never Smoker  . Smokeless tobacco: Never Used  Substance Use Topics  . Alcohol use: Not Currently  . Drug use: Never    No family hx of TB.  No Known  Allergies  Review of Systems: Review of Systems  Constitutional: Positive for fever (resolving). Negative for malaise/fatigue and weight loss.  Respiratory: Negative for cough, sputum production and shortness of breath.   Cardiovascular: Negative for chest pain and leg swelling (swelling has improved.).  Gastrointestinal: Negative for abdominal pain, nausea and vomiting.  Musculoskeletal: Negative for back pain, joint pain and myalgias.    All other systems reviewed and negative.  Objective:  Physical Exam Constitutional:      Comments: This visit was done as a telehealth visit.  Patient was conversant, not in distress, and sounded well.     Pertinent Labs and Microbiology  CBC Latest Ref Rng & Units 08/02/2020 08/01/2020 07/31/2020  WBC 4.0 - 10.5 K/uL 17.1(H) 16.1(H) 14.8(H)  Hemoglobin 13.0 - 17.0 g/dL 11.0(L) 11.1(L) 11.7(L)  Hematocrit 39 - 52 % 32.6(L) 32.9(L) 35.6(L)  Platelets 150 - 400 K/uL 464(H) 439(H) 431(H)   CMP Latest Ref Rng & Units 08/02/2020 08/01/2020 07/31/2020  Glucose 70 - 99 mg/dL 118(H) 106(H) 115(H)  BUN 8 - 23 mg/dL 19 20 24(H)  Creatinine 0.61 - 1.24 mg/dL 1.19 1.10 1.27(H)  Sodium 135 - 145 mmol/L 135 136 136  Potassium 3.5 - 5.1 mmol/L 4.2 3.9 3.8  Chloride 98 - 111 mmol/L 103 104 103  CO2 22 - 32 mmol/L 23 22 21(L)  Calcium 8.9 - 10.3 mg/dL 8.0(L) 8.2(L) 8.2(L)  Total Protein 6.5 - 8.1 g/dL 5.0(L) 5.1(L) 5.0(L)  Total Bilirubin 0.3 - 1.2 mg/dL 0.8 0.6 0.6  Alkaline Phos 38 - 126 U/L 213(H) 200(H) 160(H)  AST 15 - 41 U/L 73(H) 59(H) 47(H)  ALT 0 - 44 U/L 68(H) 55(H) 46(H)     Recent Results (from the past 240 hour(s))  Culture, blood (routine x 2)     Status: None   Collection Time: 07/28/20 12:33 PM   Specimen: BLOOD RIGHT FOREARM  Result Value Ref Range Status   Specimen Description BLOOD RIGHT FOREARM  Final   Special Requests   Final    BOTTLES DRAWN AEROBIC ONLY Blood Culture results may not be optimal due to an inadequate volume of blood  received in culture bottles   Culture   Final    NO GROWTH 5 DAYS Performed at [facility], [place] [address] [place], [place] [address]    Report Status 08/02/2020 FINAL  Final  Respiratory Panel by RT PCR (Flu A&B, Covid) - Nasopharyngeal Swab     Status: None   Collection Time: 07/28/20 12:48 PM   Specimen: Nasopharyngeal Swab  Result Value Ref Range Status   SARS Coronavirus 2 by RT PCR NEGATIVE NEGATIVE Final  Comment: (NOTE) SARS-CoV-2 target nucleic acids are NOT DETECTED.  The SARS-CoV-2 RNA is generally detectable in upper respiratoy specimens during the acute phase of infection. The lowest concentration of SARS-CoV-2 viral copies this assay can detect is 131 copies/mL. A negative result does not preclude SARS-Cov-2 infection and should not be used as the sole basis for treatment or other patient management decisions. A negative result may occur with  improper specimen collection/handling, submission of specimen other than nasopharyngeal swab, presence of viral mutation(s) within the areas targeted by this assay, and inadequate number of viral copies (<131 copies/mL). A negative result must be combined with clinical observations, patient history, and epidemiological information. The expected result is Negative.  Fact Sheet for Patients:  PinkCheek.be  Fact Sheet for Healthcare Providers:  GravelBags.it  This test is no t yet approved or cleared by the [place] FDA and  has been authorized for detection and/or diagnosis of SARS-CoV-2 by FDA under an Emergency Use Authorization (EUA). This EUA will remain  in effect (meaning this test can be used) for the duration of the COVID-19 declaration under Section 564(b)(1) of the Act, 21 U.S.C. section 360bbb-3(b)(1), unless the authorization is terminated or revoked sooner.     Influenza A by PCR NEGATIVE NEGATIVE Final   Influenza B by PCR NEGATIVE  NEGATIVE Final    Comment: (NOTE) The Xpert Xpress SARS-CoV-2/FLU/RSV assay is intended as an aid in  the diagnosis of influenza from Nasopharyngeal swab specimens and  should not be used as a sole basis for treatment. Nasal washings and  aspirates are unacceptable for Xpert Xpress SARS-CoV-2/FLU/RSV  testing.  Fact Sheet for Patients: PinkCheek.be  Fact Sheet for Healthcare Providers: GravelBags.it  This test is not yet approved or cleared by the [place] FDA and  has been authorized for detection and/or diagnosis of SARS-CoV-2 by  FDA under an Emergency Use Authorization (EUA). This EUA will remain  in effect (meaning this test can be used) for the duration of the  Covid-19 declaration under Section 564(b)(1) of the Act, 21  U.S.C. section 360bbb-3(b)(1), unless the authorization is  terminated or revoked. Performed at [facility], [place] [address], [place], [place] [address]   Culture, blood (routine x 2)     Status: None   Collection Time: 07/28/20 12:50 PM   Specimen: BLOOD  Result Value Ref Range Status   Specimen Description BLOOD LEFT ANTECUBITAL  Final   Special Requests   Final    BOTTLES DRAWN AEROBIC AND ANAEROBIC Blood Culture adequate volume   Culture   Final    NO GROWTH 5 DAYS Performed at [facility], [place] [address] [place], [place] [address]    Report Status 08/02/2020 FINAL  Final  MRSA PCR Screening     Status: None   Collection Time: 07/28/20  9:30 PM   Specimen: Nasopharyngeal  Result Value Ref Range Status   MRSA by PCR NEGATIVE NEGATIVE Final    Comment:        The GeneXpert MRSA Assay (FDA approved for NASAL specimens only), is one component of a comprehensive MRSA colonization surveillance program. It is not intended to diagnose MRSA infection nor to guide or monitor treatment for MRSA infections. Performed at [facility], [place] [address] [place], [place]  [address]     Imaging CXR 10/27 IMPRESSION:  Mild hazy left costophrenic angle opacity, which could represent  atelectasis or pneumonia. Recommend follow-up PA and lateral post  treatment chest radiographs  in 4-6 weeks.   CT chest/abd/pelvis/lumbar spine 10/30 IMPRESSION: 1. No evidence of pulmonary embolus. 2. Trace bilateral pleural effusions with atelectasis at the lung bases. 3. No acute intra-abdominal or pelvic process. 4. No acute abnormality identified involving the lumbar spine. 5. Large amount of stool in the colon. 6. Prostatomegaly. 7. Small amount of free fluid in the patient's pelvis of unknown clinical significance.  CXR 10/30 IMPRESSION: Similar appearance of the lungs to the prior plain film, with vague nodular opacity at the base of the left lung overlying the diaphragm, potentially atelectasis/scarring or small focus of pneumonia. Otherwise no new airspace disease or other acute changes. As was previously stated, follow-up PA and lateral chest x-ray recommended in 4-6 weeks.   Assessment and Plan:  1. Fever 2. Community acquired pneumonia of left lower lobe of lung 3. Leukocytosis 4. Back pain 5. Transaminitis  Unclear etiology of his fevers and symptoms that continue to resolve at this time. Extensive work up in the hospital unremarkable from imaging and lab work standpoint aside from his elevated WBC and LFTs.  He will follow up this week with his PCP to ensure that they are trending down.  ID consult notes a negative Echo as part of his work up but I do not see record of this having been done.  His back pain has improved and as noted in the HPI, CT lumbar spine did not reveal any acute process to suggest an etiology of his fevers.  It was felt that his symptoms were likely due to a viral etiology that has improved.  -- follow up with PCP this week for repeat CBC and CMP -- discussed CXR findings from 10/27 and recommendation for repeat CXR in about 6  weeks through PCP -- if ongoing fevers and viral illness felt to be less likely would then pursue an Echo (if not previously done) and consider advanced imaging of spine, however, since his symptoms are improved I think it is reasonable to see how he does especially since his lumbar CT looked okay      Raynelle High[facility] for Infectious [facility] 08/06/2020, 10:11 AM

## 2020-08-09 DIAGNOSIS — I10 Essential (primary) hypertension: Secondary | ICD-10-CM | POA: Diagnosis not present

## 2020-08-09 DIAGNOSIS — D72829 Elevated white blood cell count, unspecified: Secondary | ICD-10-CM | POA: Diagnosis not present

## 2020-08-09 DIAGNOSIS — R5383 Other fatigue: Secondary | ICD-10-CM | POA: Diagnosis not present

## 2020-08-09 DIAGNOSIS — E1165 Type 2 diabetes mellitus with hyperglycemia: Secondary | ICD-10-CM | POA: Diagnosis not present

## 2020-08-09 DIAGNOSIS — Z299 Encounter for prophylactic measures, unspecified: Secondary | ICD-10-CM | POA: Diagnosis not present

## 2020-08-10 DIAGNOSIS — R945 Abnormal results of liver function studies: Secondary | ICD-10-CM | POA: Diagnosis not present

## 2020-08-13 ENCOUNTER — Telehealth: Payer: Self-pay

## 2020-08-13 NOTE — Telephone Encounter (Signed)
Patient's wife call the office today requesting to speak with Dr. Juleen China. Advise family member that MD is in clinic today and he may not be directly available at this time.   Wife concerned about recent results that were obtained at an offsite [facility].   Advise wife she could send those results through [facility], but she states she is not "tech savy"  Wife will call [employer] and have them fax results to Dr. Juleen China for review.   Wife is requesting a phone call to discuss results.  Routing to MD to make aware

## 2020-08-13 NOTE — Telephone Encounter (Signed)
Called patient back and spoke to him and his wife.  She read through his [employer] results which were notable for WBC 14 (improved from 17), RBC 3.6, Hgb 11.2, and platelets increased to 628.  CMP with albumin 2.7, alk phos 326 (increased from 213), AST 90 (increased from 73) and ALT 218 (increased from 68).  His PCP followed by these labs with a hepatitis panel that was negative.  He has now been referred to hematology/oncology for an appointment which is scheduled for tomorrow.  He is currently off antibiotics since leaving the hospital and they asked if LFT abnormalities could have been related to antibiotics, but I would have expected higher LFT changes if this was DILI.  Regarding his fevers, they report being afebrile during much of the day.  In the evening, after he has exerted himself, they note temperatures are increased but also note that he is often using blankets at this time and temperatures are around 100 degrees.  Discussed that true fever really would be anything greater than 100.4 degrees.  At this point, I am not sure what is causing his LFT abnormalities and agree with hematology evaluation for further work up given his negative infectious work up for fevers thus far.  Also, discussed with patient that his imaging on 07/28/20 with CT scan showed no abnormality within the liver or biliary system.  They will call back with any concerns or updates.   Raynelle Highland for Infectious Disease [facility] 08/13/2020, 5:07 PM

## 2020-08-14 DIAGNOSIS — D72820 Lymphocytosis (symptomatic): Secondary | ICD-10-CM | POA: Diagnosis not present

## 2020-08-14 DIAGNOSIS — D72828 Other elevated white blood cell count: Secondary | ICD-10-CM | POA: Diagnosis not present

## 2020-08-14 DIAGNOSIS — E1122 Type 2 diabetes mellitus with diabetic chronic kidney disease: Secondary | ICD-10-CM | POA: Diagnosis not present

## 2020-08-14 DIAGNOSIS — M810 Age-related osteoporosis without current pathological fracture: Secondary | ICD-10-CM | POA: Diagnosis not present

## 2020-08-14 DIAGNOSIS — Z79899 Other long term (current) drug therapy: Secondary | ICD-10-CM | POA: Diagnosis not present

## 2020-08-14 DIAGNOSIS — D7589 Other specified diseases of blood and blood-forming organs: Secondary | ICD-10-CM | POA: Diagnosis not present

## 2020-08-14 DIAGNOSIS — R591 Generalized enlarged lymph nodes: Secondary | ICD-10-CM | POA: Diagnosis not present

## 2020-08-14 DIAGNOSIS — Z114 Encounter for screening for human immunodeficiency virus [HIV]: Secondary | ICD-10-CM | POA: Diagnosis not present

## 2020-08-14 DIAGNOSIS — F413 Other mixed anxiety disorders: Secondary | ICD-10-CM | POA: Diagnosis not present

## 2020-08-14 DIAGNOSIS — I6529 Occlusion and stenosis of unspecified carotid artery: Secondary | ICD-10-CM | POA: Diagnosis not present

## 2020-08-14 DIAGNOSIS — N189 Chronic kidney disease, unspecified: Secondary | ICD-10-CM | POA: Diagnosis not present

## 2020-08-14 DIAGNOSIS — M2550 Pain in unspecified joint: Secondary | ICD-10-CM | POA: Diagnosis not present

## 2020-08-14 DIAGNOSIS — Z8249 Family history of ischemic heart disease and other diseases of the circulatory system: Secondary | ICD-10-CM | POA: Diagnosis not present

## 2020-08-14 DIAGNOSIS — I129 Hypertensive chronic kidney disease with stage 1 through stage 4 chronic kidney disease, or unspecified chronic kidney disease: Secondary | ICD-10-CM | POA: Diagnosis not present

## 2020-08-14 DIAGNOSIS — K219 Gastro-esophageal reflux disease without esophagitis: Secondary | ICD-10-CM | POA: Diagnosis not present

## 2020-08-14 DIAGNOSIS — E785 Hyperlipidemia, unspecified: Secondary | ICD-10-CM | POA: Diagnosis not present

## 2020-08-14 DIAGNOSIS — Z87891 Personal history of nicotine dependence: Secondary | ICD-10-CM | POA: Diagnosis not present

## 2020-08-14 DIAGNOSIS — R509 Fever, unspecified: Secondary | ICD-10-CM | POA: Diagnosis not present

## 2020-08-14 DIAGNOSIS — R110 Nausea: Secondary | ICD-10-CM | POA: Diagnosis not present

## 2020-08-14 DIAGNOSIS — Z6825 Body mass index (BMI) 25.0-25.9, adult: Secondary | ICD-10-CM | POA: Diagnosis not present

## 2020-08-14 DIAGNOSIS — Z8673 Personal history of transient ischemic attack (TIA), and cerebral infarction without residual deficits: Secondary | ICD-10-CM | POA: Diagnosis not present

## 2020-08-14 DIAGNOSIS — E7800 Pure hypercholesterolemia, unspecified: Secondary | ICD-10-CM | POA: Diagnosis not present

## 2020-08-14 DIAGNOSIS — J449 Chronic obstructive pulmonary disease, unspecified: Secondary | ICD-10-CM | POA: Diagnosis not present

## 2020-08-26 DIAGNOSIS — J069 Acute upper respiratory infection, unspecified: Secondary | ICD-10-CM | POA: Diagnosis not present

## 2020-08-26 DIAGNOSIS — R059 Cough, unspecified: Secondary | ICD-10-CM | POA: Diagnosis not present

## 2020-09-03 DIAGNOSIS — R509 Fever, unspecified: Secondary | ICD-10-CM | POA: Diagnosis not present

## 2020-09-03 DIAGNOSIS — E340 Carcinoid syndrome: Secondary | ICD-10-CM | POA: Diagnosis not present

## 2020-09-03 DIAGNOSIS — Z125 Encounter for screening for malignant neoplasm of prostate: Secondary | ICD-10-CM | POA: Diagnosis not present

## 2020-09-03 DIAGNOSIS — R770 Abnormality of albumin: Secondary | ICD-10-CM | POA: Diagnosis not present

## 2020-09-03 DIAGNOSIS — R791 Abnormal coagulation profile: Secondary | ICD-10-CM | POA: Diagnosis not present

## 2020-09-03 DIAGNOSIS — N189 Chronic kidney disease, unspecified: Secondary | ICD-10-CM | POA: Diagnosis not present

## 2020-09-03 DIAGNOSIS — I129 Hypertensive chronic kidney disease with stage 1 through stage 4 chronic kidney disease, or unspecified chronic kidney disease: Secondary | ICD-10-CM | POA: Diagnosis not present

## 2020-09-03 DIAGNOSIS — J449 Chronic obstructive pulmonary disease, unspecified: Secondary | ICD-10-CM | POA: Diagnosis not present

## 2020-09-03 DIAGNOSIS — K219 Gastro-esophageal reflux disease without esophagitis: Secondary | ICD-10-CM | POA: Diagnosis not present

## 2020-09-03 DIAGNOSIS — Z87891 Personal history of nicotine dependence: Secondary | ICD-10-CM | POA: Diagnosis not present

## 2020-09-03 DIAGNOSIS — E785 Hyperlipidemia, unspecified: Secondary | ICD-10-CM | POA: Diagnosis not present

## 2020-09-03 DIAGNOSIS — E1122 Type 2 diabetes mellitus with diabetic chronic kidney disease: Secondary | ICD-10-CM | POA: Diagnosis not present

## 2020-09-03 DIAGNOSIS — D379 Neoplasm of uncertain behavior of digestive organ, unspecified: Secondary | ICD-10-CM | POA: Diagnosis not present

## 2020-09-03 DIAGNOSIS — Z6825 Body mass index (BMI) 25.0-25.9, adult: Secondary | ICD-10-CM | POA: Diagnosis not present

## 2020-09-05 ENCOUNTER — Encounter (INDEPENDENT_AMBULATORY_CARE_PROVIDER_SITE_OTHER): Payer: Self-pay | Admitting: Gastroenterology

## 2020-09-05 DIAGNOSIS — E340 Carcinoid syndrome: Secondary | ICD-10-CM | POA: Diagnosis not present

## 2020-09-05 DIAGNOSIS — D379 Neoplasm of uncertain behavior of digestive organ, unspecified: Secondary | ICD-10-CM | POA: Diagnosis not present

## 2020-09-05 DIAGNOSIS — R509 Fever, unspecified: Secondary | ICD-10-CM | POA: Diagnosis not present

## 2020-09-05 DIAGNOSIS — Z125 Encounter for screening for malignant neoplasm of prostate: Secondary | ICD-10-CM | POA: Diagnosis not present

## 2020-09-05 DIAGNOSIS — R770 Abnormality of albumin: Secondary | ICD-10-CM | POA: Diagnosis not present

## 2020-09-05 DIAGNOSIS — R791 Abnormal coagulation profile: Secondary | ICD-10-CM | POA: Diagnosis not present

## 2020-09-10 DIAGNOSIS — R059 Cough, unspecified: Secondary | ICD-10-CM | POA: Diagnosis not present

## 2020-09-10 DIAGNOSIS — Z87891 Personal history of nicotine dependence: Secondary | ICD-10-CM | POA: Diagnosis not present

## 2020-09-10 DIAGNOSIS — Z299 Encounter for prophylactic measures, unspecified: Secondary | ICD-10-CM | POA: Diagnosis not present

## 2020-09-19 ENCOUNTER — Telehealth (INDEPENDENT_AMBULATORY_CARE_PROVIDER_SITE_OTHER): Payer: Medicare Other | Admitting: Internal Medicine

## 2020-09-19 ENCOUNTER — Encounter: Payer: Self-pay | Admitting: Internal Medicine

## 2020-09-19 ENCOUNTER — Other Ambulatory Visit: Payer: Self-pay

## 2020-09-19 DIAGNOSIS — R509 Fever, unspecified: Secondary | ICD-10-CM

## 2020-09-19 DIAGNOSIS — R768 Other specified abnormal immunological findings in serum: Secondary | ICD-10-CM

## 2020-09-19 DIAGNOSIS — R7401 Elevation of levels of liver transaminase levels: Secondary | ICD-10-CM | POA: Diagnosis not present

## 2020-09-19 DIAGNOSIS — D72829 Elevated white blood cell count, unspecified: Secondary | ICD-10-CM | POA: Diagnosis not present

## 2020-09-19 DIAGNOSIS — Z8619 Personal history of other infectious and parasitic diseases: Secondary | ICD-10-CM | POA: Diagnosis not present

## 2020-09-19 NOTE — Progress Notes (Addendum)
[facility] for Infectious Disease  CHIEF COMPLAINT:    Follow up for FUO  SUBJECTIVE:    Douglas Erickson is a 74 y.o. male with PMHx as below who presents to the clinic for FUO.   Today's visit was conducted as a telehealth visit.   Please see A&P for the details of today's visit and status of the patient's medical problems.   Patient's Medications  New Prescriptions   No medications on file  Previous Medications   APPLE CID VN-GRN TEA-BIT OR-CR (APPLE CIDER VINEGAR PLUS PO)    Take 10 mLs by mouth at bedtime. Mix with water   ASCORBIC ACID (VITAMIN C) 1000 MG TABLET    Take 500 mg by mouth daily.   CALCIPOTRIENE (DOVONOX) 0.005 % CREAM    Apply topically 2 (two) times daily.   CALCIPOTRIENE-BETAMETHASONE (TACLONEX SCALP) EXTERNAL SUSPENSION    Apply 1 application topically 2 (two) times a week.    FLUOXETINE (PROZAC) 40 MG CAPSULE    Take 40 mg by mouth daily.   GARLIC 123XX123 MG CAPS    Take 1,000 mg by mouth daily.   HYDROCHLOROTHIAZIDE (HYDRODIURIL) 25 MG TABLET    Take 25 mg by mouth daily.   IBUPROFEN (ADVIL) 200 MG TABLET    Take 200-400 mg by mouth every 6 (six) hours as needed for fever or moderate pain.   MULTIPLE VITAMIN (MULTIVITAMIN) CAPSULE    Take 1 capsule by mouth daily.   OMEGA-3 FATTY ACIDS (FISH OIL) 1200 MG CAPS    Take 1,200 mg by mouth in the morning and at bedtime.   SIMVASTATIN (ZOCOR) 40 MG TABLET    Take 40 mg by mouth daily.   VITAMIN E 180 MG (400 UNITS) CAPSULE    Take 400 Units by mouth daily.  Modified Medications   No medications on file  Discontinued Medications   No medications on file     Past Medical History:  Diagnosis Date  . Diabetes mellitus without complication ([place])   . High cholesterol   . Hypertension   . Kidney disease   . Pneumonia     No family history on file.  Social History   Socioeconomic History  . Marital status: Married    Spouse name: Not on file  . Number of children: Not on file  . Years of  education: Not on file  . Highest education level: Not on file  Occupational History  . Not on file  Tobacco Use  . Smoking status: Never Smoker  . Smokeless tobacco: Never Used  Substance and Sexual Activity  . Alcohol use: Not Currently  . Drug use: Never  . Sexual activity: Not on file  Other Topics Concern  . Not on file  Social History Narrative  . Not on file   Social Determinants of Health   Financial Resource Strain: Not on file  Food Insecurity: Not on file  Transportation Needs: Not on file  Physical Activity: Not on file  Stress: Not on file  Social Connections: Not on file  Intimate Partner Violence: Not on file    No Known Allergies  Review of Systems  Constitutional: Negative for chills and fever.  Gastrointestinal: Negative.   Musculoskeletal: Negative.   Skin: Negative for rash.     OBJECTIVE:    There were no vitals filed for this visit. There is no height or weight on file to calculate BMI.  Physical Exam Visit done via telehealth.  Pt sounded well and conversant    Pertinent Labs and Microbiology CBC Latest Ref Rng & Units 08/02/2020 08/01/2020 07/31/2020  WBC 4.0 - 10.5 K/uL 17.1(H) 16.1(H) 14.8(H)  Hemoglobin 13.0 - 17.0 g/dL 11.0(L) 11.1(L) 11.7(L)  Hematocrit 39.0 - 52.0 % 32.6(L) 32.9(L) 35.6(L)  Platelets 150 - 400 K/uL 464(H) 439(H) 431(H)   CMP Latest Ref Rng & Units 08/02/2020 08/01/2020 07/31/2020  Glucose 70 - 99 mg/dL 118(H) 106(H) 115(H)  BUN 8 - 23 mg/dL 19 20 24(H)  Creatinine 0.61 - 1.24 mg/dL 1.19 1.10 1.27(H)  Sodium 135 - 145 mmol/L 135 136 136  Potassium 3.5 - 5.1 mmol/L 4.2 3.9 3.8  Chloride 98 - 111 mmol/L 103 104 103  CO2 22 - 32 mmol/L 23 22 21(L)  Calcium 8.9 - 10.3 mg/dL 8.0(L) 8.2(L) 8.2(L)  Total Protein 6.5 - 8.1 g/dL 5.0(L) 5.1(L) 5.0(L)  Total Bilirubin 0.3 - 1.2 mg/dL 0.8 0.6 0.6  Alkaline Phos 38 - 126 U/L 213(H) 200(H) 160(H)  AST 15 - 41 U/L 73(H) 59(H) 47(H)  ALT 0 - 44 U/L 68(H) 55(H) 46(H)       ASSESSMENT & PLAN:    1. Transaminitis 2. Leukocytosis 3. Fever 4. Antimitochondrial antibody positive 5. ? ehrlichiosis   Patient presents for follow up today after initial visit from 08/06/2020.  He has been seen by heme/onc at [facility] in the interim.  Multiple other infectious disease labs were sent at that time.  All were negative except for an Ehrlichia IgG Ab by [facility].  This was positive at 1:64 and antibodies are screened at a 1:64 dilution.  A single titer indicates exposure to Ehrlichia but it is unclear if this contributed to his symptoms and he, interestingly, has had resolution of fevers over the last month without any specific treatment for tickborne illness. He also generally feels improved.  At the time of his illness he did not have leukopenia or thrombocytopenia but did have mild LFT elevation.  They report today that there was no specific tick bite they recall but it is possible based on where they live.    Also, patient had a positive anti-mitochondrial antibody on labs sent by heme/onc.  Discussed with patient I am unsure of the significance of this but that I would discuss with their PCP and consider further GI evaluation.  They have an appointment with their PCP on January 7.  For now, he has decided to hold off on following up with heme/onc and prefers to see his primary for further labs.   PLAN: -- I recommended that Dr Manuella Ghazi consider repeating Ehrlichia Ab panel in January to get a convalescent serum.  A four-fold increase would suggest recent/current infection. Alternatively, could get a PCR test.   -- Again, I feel like it would be unusual to improve without treatment but Doxycycline would be effective if there is concern on repeat labs as Ehrlichiosis can certainly cause a febrile illness with elevated liver enzymes.  However, I would have expected titer to have been higher as it was checked several weeks after his initial presentation and he has improved -- I recommended  further evaluation of anti-mitochondrial antibody being positive -- I will be available as needed for further questions, but patient will follow up as needed for now.    Patient location: Home Provider location: Office Type of visit: Phone Total time: 22 minutes on the phone   Raynelle Highland for Infectious Disease [facility] 09/19/2020, 8:49 AM

## 2020-10-05 DIAGNOSIS — Z87891 Personal history of nicotine dependence: Secondary | ICD-10-CM | POA: Diagnosis not present

## 2020-10-05 DIAGNOSIS — I10 Essential (primary) hypertension: Secondary | ICD-10-CM | POA: Diagnosis not present

## 2020-10-05 DIAGNOSIS — Z299 Encounter for prophylactic measures, unspecified: Secondary | ICD-10-CM | POA: Diagnosis not present

## 2020-10-05 DIAGNOSIS — R252 Cramp and spasm: Secondary | ICD-10-CM | POA: Diagnosis not present

## 2020-10-05 DIAGNOSIS — E1165 Type 2 diabetes mellitus with hyperglycemia: Secondary | ICD-10-CM | POA: Diagnosis not present

## 2020-10-05 DIAGNOSIS — Z6824 Body mass index (BMI) 24.0-24.9, adult: Secondary | ICD-10-CM | POA: Diagnosis not present

## 2020-11-20 DIAGNOSIS — Z23 Encounter for immunization: Secondary | ICD-10-CM | POA: Diagnosis not present

## 2020-12-10 ENCOUNTER — Telehealth (INDEPENDENT_AMBULATORY_CARE_PROVIDER_SITE_OTHER): Payer: Self-pay

## 2020-12-10 ENCOUNTER — Other Ambulatory Visit (INDEPENDENT_AMBULATORY_CARE_PROVIDER_SITE_OTHER): Payer: Self-pay

## 2020-12-10 ENCOUNTER — Other Ambulatory Visit: Payer: Self-pay

## 2020-12-10 ENCOUNTER — Encounter (INDEPENDENT_AMBULATORY_CARE_PROVIDER_SITE_OTHER): Payer: Self-pay

## 2020-12-10 ENCOUNTER — Encounter (INDEPENDENT_AMBULATORY_CARE_PROVIDER_SITE_OTHER): Payer: Self-pay | Admitting: Gastroenterology

## 2020-12-10 ENCOUNTER — Ambulatory Visit (INDEPENDENT_AMBULATORY_CARE_PROVIDER_SITE_OTHER): Payer: Medicare Other | Admitting: Gastroenterology

## 2020-12-10 DIAGNOSIS — Z1211 Encounter for screening for malignant neoplasm of colon: Secondary | ICD-10-CM

## 2020-12-10 DIAGNOSIS — R195 Other fecal abnormalities: Secondary | ICD-10-CM | POA: Insufficient documentation

## 2020-12-10 MED ORDER — PEG 3350-KCL-NA BICARB-NACL 420 G PO SOLR: 4000.0000 mL | ORAL | 0 refills | Status: DC

## 2020-12-10 MED ORDER — NA SULFATE-K SULFATE-MG SULF 17.5-3.13-1.6 GM/177ML PO SOLN: 354.0000 mL | Freq: Once | ORAL | 0 refills | Status: AC

## 2020-12-10 NOTE — Telephone Encounter (Signed)
Douglas Erickson, CMA  

## 2020-12-10 NOTE — Telephone Encounter (Signed)
LeighAnn Joshlyn Beadle, CMA  

## 2020-12-10 NOTE — Patient Instructions (Signed)
Schedule colonoscopy

## 2020-12-10 NOTE — Telephone Encounter (Signed)
LeighAnn Timmy Bubeck, CMA  

## 2020-12-10 NOTE — Progress Notes (Signed)
Douglas Erickson, M.D. Gastroenterology & Hepatology [facility] [address] [place], [place] [address] Primary Care Physician: Monico Blitz, [place] [address]  Referring MD: PCP  Chief Complaint: Positive Cologuard  History of Present Illness: Douglas Erickson is a 75 y.o. male with PMH HTN, HLD, DM, CKD, who presents for evaluation of positive Cologuard.  Patient denies any complaints.  He reports that he had a positive Cologuard test in October 2021 and is requesting to undergo a colonoscopy by Dr. Laural Golden. The patient denies having any nausea, vomiting, fever, chills, hematochezia, melena, hematemesis, abdominal distention, abdominal pain, diarrhea, jaundice, pruritus or weight loss.  Last LFY:BOFBP Last Colonoscopy: 12 years ago - normal per the patient.  FHx: neg for any gastrointestinal/liver disease, no malignancies Social: neg smoking, alcohol or illicit drug use Surgical: no abdominal surgeries  Past Medical History: Past Medical History:  Diagnosis Date  . Diabetes mellitus without complication ([place])   . High cholesterol   . Hypertension   . Kidney disease   . Pneumonia     Past Surgical History:History reviewed. No pertinent surgical history.  Family History: Family History  Problem Relation Age of Onset  . Healthy Mother   . COPD Father     Social History: Social History   Tobacco Use  Smoking Status Never Smoker  Smokeless Tobacco Never Used   Social History   Substance and Sexual Activity  Alcohol Use Not Currently   Social History   Substance and Sexual Activity  Drug Use Never    Allergies: No Known Allergies  Medications: Current Outpatient Medications  Medication Sig Dispense Refill  . Apple Cid Vn-Grn Tea-Bit Or-Cr (APPLE CIDER VINEGAR PLUS PO) Take 10 mLs by mouth at bedtime. Mix with water    . Ascorbic Acid (VITAMIN C) 1000 MG tablet Take 500 mg by mouth daily.     . calcipotriene (DOVONOX) 0.005 % cream Apply topically 2 (two) times daily.    . calcipotriene-betamethasone (TACLONEX SCALP) external suspension Apply 1 application topically 2 (two) times a week.     . cetirizine (ZYRTEC) 10 MG tablet Take 10 mg by mouth daily.    . Coenzyme Q10 (CO Q 10) 100 MG CAPS Take 100 mg by mouth daily.    . Cyanocobalamin (VITAMIN B 12 PO) Take 1,000 mg by mouth daily.    Marland Kitchen FLUoxetine (PROZAC) 40 MG capsule Take 40 mg by mouth daily.    . Garlic 1025 MG CAPS Take 1,000 mg by mouth daily.    Marland Kitchen ibuprofen (ADVIL) 200 MG tablet Take 200-400 mg by mouth every 6 (six) hours as needed for fever or moderate pain.    Marland Kitchen MAGNESIUM CITRATE PO Take 100 mg by mouth in the morning and at bedtime.    Marland Kitchen MILK THISTLE PO Take 1,000 mg by mouth daily.    . Misc Natural Products (PROSTATE SUPPORT PO) Take 1,000 mg by mouth in the morning and at bedtime.    . Multiple Vitamin (MULTIVITAMIN) capsule Take 1 capsule by mouth daily.    . Omega-3 Fatty Acids (FISH OIL) 1200 MG CAPS Take 1,200 mg by mouth in the morning and at bedtime.    . Potassium 99 MG TABS Take 99 mg by mouth daily.    . simvastatin (ZOCOR) 40 MG tablet Take 20 mg by mouth daily.    . vitamin E 180 MG (400 UNITS) capsule Take 400 Units by mouth daily.  No current facility-administered medications for this visit.    Review of Systems: GENERAL: negative for malaise, night sweats HEENT: No changes in hearing or vision, no nose bleeds or other nasal problems. NECK: Negative for lumps, goiter, pain and significant neck swelling RESPIRATORY: Negative for cough, wheezing CARDIOVASCULAR: Negative for chest pain, leg swelling, palpitations, orthopnea GI: SEE HPI MUSCULOSKELETAL: Negative for joint pain or swelling, back pain, and muscle pain. SKIN: Negative for lesions, rash PSYCH: Negative for sleep disturbance, mood disorder and recent psychosocial stressors. HEMATOLOGY Negative for prolonged bleeding, bruising  easily, and swollen nodes. ENDOCRINE: Negative for cold or heat intolerance, polyuria, polydipsia and goiter. NEURO: negative for tremor, gait imbalance, syncope and seizures. The remainder of the review of systems is noncontributory.   Physical Exam: BP (!) 143/73 (BP Location: Left Arm, Patient Position: Sitting, Cuff Size: Large)   Pulse 61   Temp 98.1 F (36.7 C) (Oral)   Ht 6' (1.829 m)   Wt 188 lb (85.3 kg)   BMI 25.50 kg/m  GENERAL: The patient is AO x3, in no acute distress. HEENT: Head is normocephalic and atraumatic. EOMI are intact. Mouth is well hydrated and without lesions. NECK: Supple. No masses LUNGS: Clear to auscultation. No presence of rhonchi/wheezing/rales. Adequate chest expansion HEART: RRR, normal s1 and s2. ABDOMEN: Soft, nontender, no guarding, no peritoneal signs, and nondistended. BS +. No masses.  EXTREMITIES: Without any cyanosis, clubbing, rash, lesions or edema. NEUROLOGIC: AOx3, no focal motor deficit. SKIN: no jaundice, no rashes   Imaging/Labs: as above  I personally reviewed and interpreted the available labs, imaging and endoscopic files.  Impression and Plan: Douglas Erickson is a 75 y.o. male with PMH HTN, HLD, DM, CKD, who presents for evaluation of positive Cologuard.  The patient has been asymptomatic.  He has not had a colonoscopy in more than 10 years.  He does not have any family history of colorectal cancer. Discussed cologuard test results in detail, specifically what it means when the test is positive or negative.  Discussed that there is a possibility that even when the test is positive there may not be a polyp found on colonoscopy.  Patient understood.  I will order a colonoscopy.  The patient is adamant that he wants Dr. Laural Golden to perform this procedure.  - Schedule colonoscopy  All questions were answered.      Douglas Peppers, MD Gastroenterology and Hepatology [facility]

## 2020-12-10 NOTE — Telephone Encounter (Signed)
LeighAnn Carlson Belland, CMA  

## 2020-12-20 NOTE — Patient Instructions (Signed)
Douglas Erickson  12/20/2020     @PREFPERIOPPHARMACY @   Your procedure is scheduled on  12/26/2020.   Report to [facility] at  [phone]  A.M.   Call this number if you have problems the morning of surgery:  [phone]   Remember:  Follow the diet and prep instructions given to you by the office.                         Take these medicines the morning of surgery with A SIP OF WATER  Zyrtec, prozac.  Use your inhaler before you come.   DO NOT take any medications for diabetes the morning of your procedure.  If your glucose id 70 or below the morning of your procedure, drink 1/2 cup of clear juice ans recheck your glucose in 15 minutes. If your glucose is still 70 or below, call [phone] for instructions.  If your glucose is 300 or above the morning of your procedure, call [phone] for instructions.     Please brush your teeth.  Do not wear jewelry, make-up or nail polish.  Do not wear lotions, powders, or perfumes, or deodorant.  Do not shave 48 hours prior to surgery.  Men may shave face and neck.  Do not bring valuables to the hospital.  [facility] is not responsible for any belongings or valuables.   Contacts, dentures or bridgework may not be worn into surgery.  Leave your suitcase in the car.  After surgery it may be brought to your room.  For patients admitted to the hospital, discharge time will be determined by your treatment team.  Patients discharged the day of surgery will not be allowed to drive home and must have someone with them for 24 hours.   Special instructions:  DO NOT smoke tobacco or vape the morning of your procedure.   Please read over the following fact sheets that you were given. Anesthesia Post-op Instructions and Care and Recovery After Surgery       Colonoscopy, Adult, Care After This sheet gives you information about how to care for yourself after your procedure. Your health care provider may also give you more specific  instructions. If you have problems or questions, contact your health care provider. What can I expect after the procedure? After the procedure, it is common to have:  A small amount of blood in your stool for 24 hours after the procedure.  Some gas.  Mild cramping or bloating of your abdomen. Follow these instructions at home: Eating and drinking  Drink enough fluid to keep your urine pale yellow.  Follow instructions from your health care provider about eating or drinking restrictions.  Resume your normal diet as instructed by your health care provider. Avoid heavy or fried foods that are hard to digest.   Activity  Rest as told by your health care provider.  Avoid sitting for a long time without moving. Get up to take short walks every 1-2 hours. This is important to improve blood flow and breathing. Ask for help if you feel weak or unsteady.  Return to your normal activities as told by your health care provider. Ask your health care provider what activities are safe for you. Managing cramping and bloating  Try walking around when you have cramps or feel bloated.  Apply heat to your abdomen as told by your health care provider. Use the heat source that your health care provider  recommends, such as a moist heat pack or a heating pad. ? Place a towel between your skin and the heat source. ? Leave the heat on for 20-30 minutes. ? Remove the heat if your skin turns bright red. This is especially important if you are unable to feel pain, heat, or cold. You may have a greater risk of getting burned.   General instructions  If you were given a sedative during the procedure, it can affect you for several hours. Do not drive or operate machinery until your health care provider says that it is safe.  For the first 24 hours after the procedure: ? Do not sign important documents. ? Do not drink alcohol. ? Do your regular daily activities at a slower pace than normal. ? Eat soft foods  that are easy to digest.  Take over-the-counter and prescription medicines only as told by your health care provider.  Keep all follow-up visits as told by your health care provider. This is important. Contact a health care provider if:  You have blood in your stool 2-3 days after the procedure. Get help right away if you have:  More than a small spotting of blood in your stool.  Large blood clots in your stool.  Swelling of your abdomen.  Nausea or vomiting.  A fever.  Increasing pain in your abdomen that is not relieved with medicine. Summary  After the procedure, it is common to have a small amount of blood in your stool. You may also have mild cramping and bloating of your abdomen.  If you were given a sedative during the procedure, it can affect you for several hours. Do not drive or operate machinery until your health care provider says that it is safe.  Get help right away if you have a lot of blood in your stool, nausea or vomiting, a fever, or increased pain in your abdomen. This information is not intended to replace advice given to you by your health care provider. Make sure you discuss any questions you have with your health care provider. Document Revised: 09/09/2019 Document Reviewed: 04/11/2019 Elsevier Patient Education  2021 [place] After This sheet gives you information about how to care for yourself after your procedure. Your health care provider may also give you more specific instructions. If you have problems or questions, contact your health care provider. What can I expect after the procedure? After the procedure, it is common to have:  Tiredness.  Forgetfulness about what happened after the procedure.  Impaired judgment for important decisions.  Nausea or vomiting.  Some difficulty with balance. Follow these instructions at home: For the time period you were told by your health care provider:  Rest as  needed.  Do not participate in activities where you could fall or become injured.  Do not drive or use machinery.  Do not drink alcohol.  Do not take sleeping pills or medicines that cause drowsiness.  Do not make important decisions or sign legal documents.  Do not take care of children on your own.      Eating and drinking  Follow the diet that is recommended by your health care provider.  Drink enough fluid to keep your urine pale yellow.  If you vomit: ? Drink water, juice, or soup when you can drink without vomiting. ? Make sure you have little or no nausea before eating solid foods. General instructions  Have a responsible adult stay with you for the time you  are told. It is important to have someone help care for you until you are awake and alert.  Take over-the-counter and prescription medicines only as told by your health care provider.  If you have sleep apnea, surgery and certain medicines can increase your risk for breathing problems. Follow instructions from your health care provider about wearing your sleep device: ? Anytime you are sleeping, including during daytime naps. ? While taking prescription pain medicines, sleeping medicines, or medicines that make you drowsy.  Avoid smoking.  Keep all follow-up visits as told by your health care provider. This is important. Contact a health care provider if:  You keep feeling nauseous or you keep vomiting.  You feel light-headed.  You are still sleepy or having trouble with balance after 24 hours.  You develop a rash.  You have a fever.  You have redness or swelling around the IV site. Get help right away if:  You have trouble breathing.  You have new-onset confusion at home. Summary  For several hours after your procedure, you may feel tired. You may also be forgetful and have poor judgment.  Have a responsible adult stay with you for the time you are told. It is important to have someone help care  for you until you are awake and alert.  Rest as told. Do not drive or operate machinery. Do not drink alcohol or take sleeping pills.  Get help right away if you have trouble breathing, or if you suddenly become confused. This information is not intended to replace advice given to you by your health care provider. Make sure you discuss any questions you have with your health care provider. Document Revised: 05/31/2020 Document Reviewed: 08/18/2019 Elsevier Patient Education  2021 [employer].

## 2020-12-24 ENCOUNTER — Encounter (HOSPITAL_COMMUNITY): Payer: Self-pay

## 2020-12-24 ENCOUNTER — Other Ambulatory Visit (HOSPITAL_COMMUNITY): Payer: Medicare Other

## 2020-12-24 ENCOUNTER — Encounter (HOSPITAL_COMMUNITY)
Admission: RE | Admit: 2020-12-24 | Discharge: 2020-12-24 | Disposition: A | Discharge status: Home / Self Care | Payer: Medicare Other | Source: Ambulatory Visit | Attending: Internal Medicine | Admitting: Internal Medicine

## 2020-12-26 ENCOUNTER — Encounter (HOSPITAL_COMMUNITY): Payer: Self-pay

## 2020-12-26 ENCOUNTER — Ambulatory Visit (HOSPITAL_COMMUNITY): Admit: 2020-12-26 | Payer: Medicare Other | Admitting: Internal Medicine

## 2020-12-26 SURGERY — COLONOSCOPY WITH PROPOFOL
Anesthesia: Monitor Anesthesia Care

## 2020-12-27 ENCOUNTER — Other Ambulatory Visit (INDEPENDENT_AMBULATORY_CARE_PROVIDER_SITE_OTHER): Payer: Self-pay

## 2021-01-03 NOTE — Patient Instructions (Signed)
Douglas Erickson  01/03/2021     @PREFPERIOPPHARMACY @   Your procedure is scheduled on  01/09/2021   Report to [facility] at  0800  A.M.   Call this number if you have problems the morning of surgery:  [phone]   Remember:  Follow the diet and prep instructions given to you by the office.                      Take these medicines the morning of surgery with A SIP OF WATER  Zyrtec, prozac.   Use your inhaler before you come and bring your rescue inhaler with you.  If your glucose is 70 or below the morning of your procedure, drink 1/2 cup of clear juice and recheck your glucose in 15 minutes. If your glucose is still 70 or below, call [phone] for instructions.  If your glucose is 300 or above the morning of your procedure, call [phone] for instructions.     Plese brush your teeth.  Do not wear jewelry, make-up or nail polish.  Do not wear lotions, powders, or perfumes, or deodorant.  Do not shave 48 hours prior to surgery.  Men may shave face and neck.  Do not bring valuables to the hospital.  [facility] is not responsible for any belongings or valuables.   Contacts, dentures or bridgework may not be worn into surgery.  Leave your suitcase in the car.  After surgery it may be brought to your room.  For patients admitted to the hospital, discharge time will be determined by your treatment team.  Patients discharged the day of surgery will not be allowed to drive home and must have someone with them for 24 hours.     Special instructions:  DO NOT smoke tobacco or vape for 24 hours before your procedure.   Please read over the following fact sheets that you were given. Anesthesia Post-op Instructions and Care and Recovery After Surgery       Colonoscopy, Adult, Care After This sheet gives you information about how to care for yourself after your procedure. Your health care provider may also give you more specific instructions. If you have problems or  questions, contact your health care provider. What can I expect after the procedure? After the procedure, it is common to have:  A small amount of blood in your stool for 24 hours after the procedure.  Some gas.  Mild cramping or bloating of your abdomen. Follow these instructions at home: Eating and drinking  Drink enough fluid to keep your urine pale yellow.  Follow instructions from your health care provider about eating or drinking restrictions.  Resume your normal diet as instructed by your health care provider. Avoid heavy or fried foods that are hard to digest.   Activity  Rest as told by your health care provider.  Avoid sitting for a long time without moving. Get up to take short walks every 1-2 hours. This is important to improve blood flow and breathing. Ask for help if you feel weak or unsteady.  Return to your normal activities as told by your health care provider. Ask your health care provider what activities are safe for you. Managing cramping and bloating  Try walking around when you have cramps or feel bloated.  Apply heat to your abdomen as told by your health care provider. Use the heat source that your health care provider recommends, such as a moist heat  pack or a heating pad. ? Place a towel between your skin and the heat source. ? Leave the heat on for 20-30 minutes. ? Remove the heat if your skin turns bright red. This is especially important if you are unable to feel pain, heat, or cold. You may have a greater risk of getting burned.   General instructions  If you were given a sedative during the procedure, it can affect you for several hours. Do not drive or operate machinery until your health care provider says that it is safe.  For the first 24 hours after the procedure: ? Do not sign important documents. ? Do not drink alcohol. ? Do your regular daily activities at a slower pace than normal. ? Eat soft foods that are easy to digest.  Take  over-the-counter and prescription medicines only as told by your health care provider.  Keep all follow-up visits as told by your health care provider. This is important. Contact a health care provider if:  You have blood in your stool 2-3 days after the procedure. Get help right away if you have:  More than a small spotting of blood in your stool.  Large blood clots in your stool.  Swelling of your abdomen.  Nausea or vomiting.  A fever.  Increasing pain in your abdomen that is not relieved with medicine. Summary  After the procedure, it is common to have a small amount of blood in your stool. You may also have mild cramping and bloating of your abdomen.  If you were given a sedative during the procedure, it can affect you for several hours. Do not drive or operate machinery until your health care provider says that it is safe.  Get help right away if you have a lot of blood in your stool, nausea or vomiting, a fever, or increased pain in your abdomen. This information is not intended to replace advice given to you by your health care provider. Make sure you discuss any questions you have with your health care provider. Document Revised: 09/09/2019 Document Reviewed: 04/11/2019 Elsevier Patient Education  2021 [place] After This sheet gives you information about how to care for yourself after your procedure. Your health care provider may also give you more specific instructions. If you have problems or questions, contact your health care provider. What can I expect after the procedure? After the procedure, it is common to have:  Tiredness.  Forgetfulness about what happened after the procedure.  Impaired judgment for important decisions.  Nausea or vomiting.  Some difficulty with balance. Follow these instructions at home: For the time period you were told by your health care provider:  Rest as needed.  Do not participate in  activities where you could fall or become injured.  Do not drive or use machinery.  Do not drink alcohol.  Do not take sleeping pills or medicines that cause drowsiness.  Do not make important decisions or sign legal documents.  Do not take care of children on your own.      Eating and drinking  Follow the diet that is recommended by your health care provider.  Drink enough fluid to keep your urine pale yellow.  If you vomit: ? Drink water, juice, or soup when you can drink without vomiting. ? Make sure you have little or no nausea before eating solid foods. General instructions  Have a responsible adult stay with you for the time you are told. It is important to  have someone help care for you until you are awake and alert.  Take over-the-counter and prescription medicines only as told by your health care provider.  If you have sleep apnea, surgery and certain medicines can increase your risk for breathing problems. Follow instructions from your health care provider about wearing your sleep device: ? Anytime you are sleeping, including during daytime naps. ? While taking prescription pain medicines, sleeping medicines, or medicines that make you drowsy.  Avoid smoking.  Keep all follow-up visits as told by your health care provider. This is important. Contact a health care provider if:  You keep feeling nauseous or you keep vomiting.  You feel light-headed.  You are still sleepy or having trouble with balance after 24 hours.  You develop a rash.  You have a fever.  You have redness or swelling around the IV site. Get help right away if:  You have trouble breathing.  You have new-onset confusion at home. Summary  For several hours after your procedure, you may feel tired. You may also be forgetful and have poor judgment.  Have a responsible adult stay with you for the time you are told. It is important to have someone help care for you until you are awake and  alert.  Rest as told. Do not drive or operate machinery. Do not drink alcohol or take sleeping pills.  Get help right away if you have trouble breathing, or if you suddenly become confused. This information is not intended to replace advice given to you by your health care provider. Make sure you discuss any questions you have with your health care provider. Document Revised: 05/31/2020 Document Reviewed: 08/18/2019 Elsevier Patient Education  2021 [employer].

## 2021-01-07 ENCOUNTER — Encounter (HOSPITAL_COMMUNITY)
Admission: RE | Admit: 2021-01-07 | Discharge: 2021-01-07 | Disposition: A | Discharge status: Home / Self Care | Payer: Medicare Other | Source: Ambulatory Visit | Attending: Internal Medicine | Admitting: Internal Medicine

## 2021-01-07 ENCOUNTER — Encounter (HOSPITAL_COMMUNITY): Payer: Self-pay

## 2021-01-07 ENCOUNTER — Other Ambulatory Visit: Payer: Self-pay

## 2021-01-07 ENCOUNTER — Other Ambulatory Visit (HOSPITAL_COMMUNITY)
Admission: RE | Admit: 2021-01-07 | Discharge: 2021-01-07 | Disposition: A | Discharge status: Home / Self Care | Payer: Medicare Other | Source: Ambulatory Visit | Attending: Internal Medicine | Admitting: Internal Medicine

## 2021-01-07 DIAGNOSIS — Z01812 Encounter for preprocedural laboratory examination: Secondary | ICD-10-CM | POA: Insufficient documentation

## 2021-01-07 DIAGNOSIS — Z20822 Contact with and (suspected) exposure to covid-19: Secondary | ICD-10-CM | POA: Diagnosis not present

## 2021-01-07 HISTORY — DX: Anxiety disorder, unspecified: F41.9

## 2021-01-07 HISTORY — DX: Depression, unspecified: F32.A

## 2021-01-07 HISTORY — DX: Unspecified asthma, uncomplicated: J45.909

## 2021-01-07 LAB — BASIC METABOLIC PANEL
Anion gap: 11 (ref 5–15)
BUN: 26 mg/dL — ABNORMAL HIGH (ref 8–23)
CO2: 24 mmol/L (ref 22–32)
Calcium: 9.7 mg/dL (ref 8.9–10.3)
Chloride: 102 mmol/L (ref 98–111)
Creatinine, Ser: 1.05 mg/dL (ref 0.61–1.24)
GFR, Estimated: >60 mL/min (ref >60)
Glucose, Bld: 123 mg/dL — ABNORMAL HIGH (ref 70–99)
Potassium: 4.1 mmol/L (ref 3.5–5.1)
Sodium: 137 mmol/L (ref 135–145)

## 2021-01-07 LAB — SARS CORONAVIRUS 2 (TAT 6-24 HRS): SARS Coronavirus 2: NEGATIVE

## 2021-01-09 ENCOUNTER — Ambulatory Visit (HOSPITAL_COMMUNITY): Payer: Medicare Other | Admitting: Anesthesiology

## 2021-01-09 ENCOUNTER — Encounter (HOSPITAL_COMMUNITY)
Admission: RE | Disposition: A | Discharge status: Home / Self Care | Payer: Self-pay | Source: Home / Self Care | Attending: Internal Medicine

## 2021-01-09 ENCOUNTER — Encounter (HOSPITAL_COMMUNITY): Payer: Self-pay | Admitting: Internal Medicine

## 2021-01-09 ENCOUNTER — Ambulatory Visit (HOSPITAL_COMMUNITY)
Admission: RE | Admit: 2021-01-09 | Discharge: 2021-01-09 | Disposition: A | Discharge status: Home / Self Care | Payer: Medicare Other | Attending: Internal Medicine | Admitting: Internal Medicine

## 2021-01-09 DIAGNOSIS — Z7951 Long term (current) use of inhaled steroids: Secondary | ICD-10-CM | POA: Diagnosis not present

## 2021-01-09 DIAGNOSIS — R195 Other fecal abnormalities: Secondary | ICD-10-CM | POA: Diagnosis not present

## 2021-01-09 DIAGNOSIS — E119 Type 2 diabetes mellitus without complications: Secondary | ICD-10-CM | POA: Insufficient documentation

## 2021-01-09 DIAGNOSIS — J45909 Unspecified asthma, uncomplicated: Secondary | ICD-10-CM | POA: Insufficient documentation

## 2021-01-09 DIAGNOSIS — K648 Other hemorrhoids: Secondary | ICD-10-CM | POA: Insufficient documentation

## 2021-01-09 DIAGNOSIS — K644 Residual hemorrhoidal skin tags: Secondary | ICD-10-CM | POA: Diagnosis not present

## 2021-01-09 DIAGNOSIS — K5730 Diverticulosis of large intestine without perforation or abscess without bleeding: Secondary | ICD-10-CM | POA: Insufficient documentation

## 2021-01-09 DIAGNOSIS — Z79899 Other long term (current) drug therapy: Secondary | ICD-10-CM | POA: Diagnosis not present

## 2021-01-09 DIAGNOSIS — D123 Benign neoplasm of transverse colon: Secondary | ICD-10-CM | POA: Insufficient documentation

## 2021-01-09 DIAGNOSIS — I10 Essential (primary) hypertension: Secondary | ICD-10-CM | POA: Insufficient documentation

## 2021-01-09 DIAGNOSIS — K635 Polyp of colon: Secondary | ICD-10-CM | POA: Diagnosis not present

## 2021-01-09 DIAGNOSIS — Z825 Family history of asthma and other chronic lower respiratory diseases: Secondary | ICD-10-CM | POA: Diagnosis not present

## 2021-01-09 DIAGNOSIS — Z791 Long term (current) use of non-steroidal anti-inflammatories (NSAID): Secondary | ICD-10-CM | POA: Diagnosis not present

## 2021-01-09 HISTORY — PX: POLYPECTOMY: SHX5525

## 2021-01-09 HISTORY — PX: COLONOSCOPY WITH PROPOFOL: SHX5780

## 2021-01-09 LAB — GLUCOSE, CAPILLARY: Glucose-Capillary: 100 mg/dL — ABNORMAL HIGH (ref 70–99)

## 2021-01-09 SURGERY — COLONOSCOPY WITH PROPOFOL
Anesthesia: General

## 2021-01-09 MED ORDER — PROPOFOL 500 MG/50ML IV EMUL
INTRAVENOUS | Status: DC | PRN
  Administered 2021-01-09: 25 ug/kg/min via INTRAVENOUS

## 2021-01-09 MED ORDER — PROPOFOL 10 MG/ML IV BOLUS
INTRAVENOUS | Status: AC
  Filled 2021-01-09: qty 60

## 2021-01-09 MED ORDER — SIMETHICONE 40 MG/0.6ML PO SUSP
ORAL | Status: AC
  Filled 2021-01-09: qty 0.6

## 2021-01-09 MED ORDER — PROPOFOL 10 MG/ML IV BOLUS
INTRAVENOUS | Status: DC | PRN
  Administered 2021-01-09: 25 mg via INTRAVENOUS
  Administered 2021-01-09: 30 mg via INTRAVENOUS
  Administered 2021-01-09: 50 mg via INTRAVENOUS
  Administered 2021-01-09 (×2): 10 mg via INTRAVENOUS

## 2021-01-09 MED ORDER — LACTATED RINGERS IV SOLN
INTRAVENOUS | Status: DC
  Administered 2021-01-09: 1000 mL via INTRAVENOUS

## 2021-01-09 NOTE — Op Note (Signed)
[name] Patient Name: Douglas Erickson Procedure Date: 01/09/2021 8:52 AM MRN: [id number] Date of Birth: 10/07/1945 Attending MD: Hildred Laser , MD CSN: [id number] Age: 75 Admit Type: Outpatient Procedure:                Colonoscopy Indications:              Positive Cologuard test Providers:                Hildred Laser, MD, Gwenlyn Fudge, RN, Randa Spike, Technician Referring MD:             Fuller Canada Manuella Ghazi MD, MD Medicines:                Propofol per Anesthesia Complications:            No immediate complications. Estimated Blood Loss:     Estimated blood loss was minimal. Procedure:                Pre-Anesthesia Assessment:                           - Prior to the procedure, a History and Physical                            was performed, and patient medications and                            allergies were reviewed. The patient's tolerance of                            previous anesthesia was also reviewed. The risks                            and benefits of the procedure and the sedation                            options and risks were discussed with the patient.                            All questions were answered, and informed consent                            was obtained. Prior Anticoagulants: The patient has                            taken no previous anticoagulant or antiplatelet                            agents except for NSAID medication. ASA Grade                            Assessment: II - A patient with mild systemic  disease. After reviewing the risks and benefits,                            the patient was deemed in satisfactory condition to                            undergo the procedure.                           After obtaining informed consent, the colonoscope                            was passed under direct vision. Throughout the                            procedure, the patient's blood  pressure, pulse, and                            oxygen saturations were monitored continuously. The                            [id number]-[id number] ([id number]) scope was introduced through                            the anus and advanced to the the cecum, identified                            by appendiceal orifice and ileocecal valve. The                            colonoscopy was performed without difficulty. The                            patient tolerated the procedure well. The quality                            of the bowel preparation was adequate. The                            ileocecal valve, appendiceal orifice, and rectum                            were photographed. Scope In: 9:10:56 AM Scope Out: 9:43:12 AM Scope Withdrawal Time: 0 hours 21 minutes 2 seconds  Total Procedure Duration: 0 hours 32 minutes 16 seconds  Findings:      Skin tags were found on perianal exam.      Scattered diverticula were found in the sigmoid colon, transverse colon       and hepatic flexure.      Two polyps were found in the splenic flexure and transverse colon. The       polyps were small in size. These were biopsied with a cold forceps for       histology. The pathology specimen was placed into Bottle Number 1.      External and internal hemorrhoids were found  during retroflexion. The       hemorrhoids were medium-sized. Impression:               - Perianal skin tags found on perianal exam.                           - Diverticulosis in the sigmoid colon, in the                            transverse colon and at the hepatic flexure.                           - Two small polyps at the splenic flexure and in                            the transverse colon. Biopsied.                           - External and internal hemorrhoids. Moderate Sedation:      Per Anesthesia Care Recommendation:           - High fiber diet and diabetic (ADA) diet today.                           - Continue present medications.                            - No aspirin, ibuprofen, naproxen, or other                            non-steroidal anti-inflammatory drugs for 1 day.                           - Await pathology results.                           - Repeat colonoscopy is recommended. The                            colonoscopy date will be determined after pathology                            results from today's exam become available for                            review. Procedure Code(s):        --- Professional ---                           [phone], Colonoscopy, flexible; with biopsy, single                            or multiple Diagnosis Code(s):        --- Professional ---                           K64.8, Other hemorrhoids  K63.5, Polyp of colon                           K64.4, Residual hemorrhoidal skin tags                           R19.5, Other fecal abnormalities                           K57.30, Diverticulosis of large intestine without                            perforation or abscess without bleeding CPT copyright 2019 [employer]. All rights reserved. The codes documented in this report are preliminary and upon coder review may  be revised to meet current compliance requirements. Hildred Laser, MD Hildred Laser, MD 01/09/2021 9:57:26 AM This report has been signed electronically. Number of Addenda: 0

## 2021-01-09 NOTE — Discharge Instructions (Signed)
No aspirin or NSAIDs for 24 hours. Resume other medications as before. Modified carb high-fiber diet No driving for 24 hours Physician will call with biopsy results.    Colonoscopy, Adult, Care After This sheet gives you information about how to care for yourself after your procedure. Your health care provider may also give you more specific instructions. If you have problems or questions, contact your health care provider. What can I expect after the procedure? After the procedure, it is common to have:  A small amount of blood in your stool for 24 hours after the procedure.  Some gas.  Mild cramping or bloating of your abdomen. Follow these instructions at home: Eating and drinking  Drink enough fluid to keep your urine pale yellow.  Follow instructions from your health care provider about eating or drinking restrictions.  Resume your normal diet as instructed by your health care provider. Avoid heavy or fried foods that are hard to digest.   Activity  Rest as told by your health care provider.  Avoid sitting for a long time without moving. Get up to take short walks every 1-2 hours. This is important to improve blood flow and breathing. Ask for help if you feel weak or unsteady.  Return to your normal activities as told by your health care provider. Ask your health care provider what activities are safe for you. Managing cramping and bloating  Try walking around when you have cramps or feel bloated.  Apply heat to your abdomen as told by your health care provider. Use the heat source that your health care provider recommends, such as a moist heat pack or a heating pad. ? Place a towel between your skin and the heat source. ? Leave the heat on for 20-30 minutes. ? Remove the heat if your skin turns bright red. This is especially important if you are unable to feel pain, heat, or cold. You may have a greater risk of getting burned.   General instructions  If you were given  a sedative during the procedure, it can affect you for several hours. Do not drive or operate machinery until your health care provider says that it is safe.  For the first 24 hours after the procedure: ? Do not sign important documents. ? Do not drink alcohol. ? Do your regular daily activities at a slower pace than normal. ? Eat soft foods that are easy to digest.  Take over-the-counter and prescription medicines only as told by your health care provider.  Keep all follow-up visits as told by your health care provider. This is important. Contact a health care provider if:  You have blood in your stool 2-3 days after the procedure. Get help right away if you have:  More than a small spotting of blood in your stool.  Large blood clots in your stool.  Swelling of your abdomen.  Nausea or vomiting.  A fever.  Increasing pain in your abdomen that is not relieved with medicine. Summary  After the procedure, it is common to have a small amount of blood in your stool. You may also have mild cramping and bloating of your abdomen.  If you were given a sedative during the procedure, it can affect you for several hours. Do not drive or operate machinery until your health care provider says that it is safe.  Get help right away if you have a lot of blood in your stool, nausea or vomiting, a fever, or increased pain in your abdomen. This  information is not intended to replace advice given to you by your health care provider. Make sure you discuss any questions you have with your health care provider. Document Revised: 09/09/2019 Document Reviewed: 04/11/2019 Elsevier Patient Education  [place].  Colon Polyps  Colon polyps are tissue growths inside the colon, which is part of the large intestine. They are one of the types of polyps that can grow in the body. A polyp may be a round bump or a mushroom-shaped growth. You could have one polyp or more than one. Most colon polyps are  noncancerous (benign). However, some colon polyps can become cancerous over time. Finding and removing the polyps early can help prevent this. What are the causes? The exact cause of colon polyps is not known. What increases the risk? The following factors may make you more likely to develop this condition:  Having a family history of colorectal cancer or colon polyps.  Being older than 73 years of age.  Being younger than 73 years of age and having a significant family history of colorectal cancer or colon polyps or a genetic condition that puts you at higher risk of getting colon polyps.  Having inflammatory bowel disease, such as ulcerative colitis or Crohn's disease.  Having certain conditions passed from parent to child (hereditary conditions), such as: ? Familial adenomatous polyposis (FAP). ? Lynch syndrome. ? Turcot syndrome. ? Peutz-Jeghers syndrome. ? MUTYH-associated polyposis (MAP).  Being overweight.  Certain lifestyle factors. These include smoking cigarettes, drinking too much alcohol, not getting enough exercise, and eating a diet that is high in fat and red meat and low in fiber.  Having had childhood cancer that was treated with radiation of the abdomen. What are the signs or symptoms? Many times, there are no symptoms. If you have symptoms, they may include:  Blood coming from the rectum during a bowel movement.  Blood in the stool (feces). The blood may be bright red or very dark in color.  Pain in the abdomen.  A change in bowel habits, such as constipation or diarrhea. How is this diagnosed? This condition is diagnosed with a colonoscopy. This is a procedure in which a lighted, flexible scope is inserted into the opening between the buttocks (anus) and then passed into the colon to examine the area. Polyps are sometimes found when a colonoscopy is done as part of routine cancer screening tests. How is this treated? This condition is treated by removing  any polyps that are found. Most polyps can be removed during a colonoscopy. Those polyps will then be tested for cancer. Additional treatment may be needed depending on the results of testing. Follow these instructions at home: Eating and drinking  Eat foods that are high in fiber, such as fruits, vegetables, and whole grains.  Eat foods that are high in calcium and vitamin D, such as milk, cheese, yogurt, eggs, liver, fish, and broccoli.  Limit foods that are high in fat, such as fried foods and desserts.  Limit the amount of red meat, precooked or cured meat, or other processed meat that you eat, such as hot dogs, sausages, bacon, or meat loaves.  Limit sugary drinks.   Lifestyle  Maintain a healthy weight, or lose weight if recommended by your health care provider.  Exercise every day or as told by your health care provider.  Do not use any products that contain nicotine or tobacco, such as cigarettes, e-cigarettes, and chewing tobacco. If you need help quitting, ask your health care  provider.  Do not drink alcohol if: ? Your health care provider tells you not to drink. ? You are pregnant, may be pregnant, or are planning to become pregnant.  If you drink alcohol: ? Limit how much you use to:  0-1 drink a day for women.  0-2 drinks a day for men. ? Know how much alcohol is in your drink. In the [place] one drink equals one 12 oz bottle of beer (355 mL), one 5 oz glass of wine (148 mL), or one 1 oz glass of hard liquor (44 mL). General instructions  Take over-the-counter and prescription medicines only as told by your health care provider.  Keep all follow-up visits. This is important. This includes having regularly scheduled colonoscopies. Talk to your health care provider about when you need a colonoscopy. Contact a health care provider if:  You have new or worsening bleeding during a bowel movement.  You have new or increased blood in your stool.  You have a change in  bowel habits.  You lose weight for no known reason. Summary  Colon polyps are tissue growths inside the colon, which is part of the large intestine. They are one type of polyp that can grow in the body.  Most colon polyps are noncancerous (benign), but some can become cancerous over time.  This condition is diagnosed with a colonoscopy.  This condition is treated by removing any polyps that are found. Most polyps can be removed during a colonoscopy. This information is not intended to replace advice given to you by your health care provider. Make sure you discuss any questions you have with your health care provider. Document Revised: 01/04/2020 Document Reviewed: 01/04/2020 Elsevier Patient Education  2021 [place].  High-Fiber Eating Plan Fiber, also called dietary fiber, is a type of carbohydrate. It is found foods such as fruits, vegetables, whole grains, and beans. A high-fiber diet can have many health benefits. Your health care provider may recommend a high-fiber diet to help:  Prevent constipation. Fiber can make your bowel movements more regular.  Lower your cholesterol.  Relieve the following conditions: ? Inflammation of veins in the anus (hemorrhoids). ? Inflammation of specific areas of the digestive tract (uncomplicated diverticulosis). ? A problem of the large intestine, also called the colon, that sometimes causes pain and diarrhea (irritable bowel syndrome, or IBS).  Prevent overeating as part of a weight-loss plan.  Prevent heart disease, type 2 diabetes, and certain cancers. What are tips for following this plan? Reading food labels  Check the nutrition facts label on food products for the amount of dietary fiber. Choose foods that have 5 grams of fiber or more per serving.  The goals for recommended daily fiber intake include: ? Men (age 47 or younger): 34-38 g. ? Men (over age 67): 28-34 g. ? Women (age 45 or younger): 25-28 g. ? Women (over age 66):  22-25 g. Your daily fiber goal is _____________ g.   Shopping  Choose whole fruits and vegetables instead of processed forms, such as apple juice or applesauce.  Choose a wide variety of high-fiber foods such as avocados, lentils, oats, and kidney beans.  Read the nutrition facts label of the foods you choose. Be aware of foods with added fiber. These foods often have high sugar and sodium amounts per serving. Cooking  Use whole-grain flour for baking and cooking.  Cook with brown rice instead of white rice. Meal planning  Start the day with a breakfast that is high in  fiber, such as a cereal that contains 5 g of fiber or more per serving.  Eat breads and cereals that are made with whole-grain flour instead of refined flour or white flour.  Eat brown rice, bulgur wheat, or millet instead of white rice.  Use beans in place of meat in soups, salads, and pasta dishes.  Be sure that half of the grains you eat each day are whole grains. General information  You can get the recommended daily intake of dietary fiber by: ? Eating a variety of fruits, vegetables, grains, nuts, and beans. ? Taking a fiber supplement if you are not able to take in enough fiber in your diet. It is better to get fiber through food than from a supplement.  Gradually increase how much fiber you consume. If you increase your intake of dietary fiber too quickly, you may have bloating, cramping, or gas.  Drink plenty of water to help you digest fiber.  Choose high-fiber snacks, such as berries, raw vegetables, nuts, and popcorn. What foods should I eat? Fruits Berries. Pears. Apples. Oranges. Avocado. Prunes and raisins. Dried figs. Vegetables Sweet potatoes. Spinach. Kale. Artichokes. Cabbage. Broccoli. Cauliflower. Green peas. Carrots. Squash. Grains Whole-grain breads. Multigrain cereal. Oats and oatmeal. Brown rice. Barley. Bulgur wheat. [place]. Quinoa. Bran muffins. Popcorn. Rye wafer crackers. Meats  and other proteins Navy beans, kidney beans, and pinto beans. Soybeans. Split peas. Lentils. Nuts and seeds. Dairy Fiber-fortified yogurt. Beverages Fiber-fortified soy milk. Fiber-fortified orange juice. Other foods Fiber bars. The items listed above may not be a complete list of recommended foods and beverages. Contact a dietitian for more information. What foods should I avoid? Fruits Fruit juice. Cooked, strained fruit. Vegetables Fried potatoes. Canned vegetables. Well-cooked vegetables. Grains White bread. Pasta made with refined flour. White rice. Meats and other proteins Fatty cuts of meat. Fried chicken or fried fish. Dairy Milk. Yogurt. Cream cheese. Sour cream. Fats and oils Butters. Beverages Soft drinks. Other foods Cakes and pastries. The items listed above may not be a complete list of foods and beverages to avoid. Talk with your dietitian about what choices are best for you. Summary  Fiber is a type of carbohydrate. It is found in foods such as fruits, vegetables, whole grains, and beans.  A high-fiber diet has many benefits. It can help to prevent constipation, lower blood cholesterol, aid weight loss, and reduce your risk of heart disease, diabetes, and certain cancers.  Increase your intake of fiber gradually. Increasing fiber too quickly may cause cramping, bloating, and gas. Drink plenty of water while you increase the amount of fiber you consume.  The best sources of fiber include whole fruits and vegetables, whole grains, nuts, seeds, and beans. This information is not intended to replace advice given to you by your health care provider. Make sure you discuss any questions you have with your health care provider. Document Revised: 01/19/2020 Document Reviewed: 01/19/2020 Elsevier Patient Education  2021 [employer].

## 2021-01-09 NOTE — Transfer of Care (Signed)
Immediate Anesthesia Transfer of Care Note  Patient: Douglas Erickson  Procedure(s) Performed: COLONOSCOPY WITH PROPOFOL (N/A ) POLYPECTOMY  Patient Location: Short Stay  Anesthesia Type:General  Level of Consciousness: awake  Airway & Oxygen Therapy: Patient Spontanous Breathing  Post-op Assessment: Report given to RN  Post vital signs: Reviewed  Last Vitals:  Vitals Value Taken Time  BP    Temp    Pulse    Resp    SpO2      Last Pain:  Vitals:   01/09/21 0906  TempSrc:   PainSc: 0-No pain      Patients Stated Pain Goal: 8 (35/67/01 4103)  Complications: No complications documented.

## 2021-01-09 NOTE — Anesthesia Preprocedure Evaluation (Signed)
Anesthesia Evaluation  Patient identified by MRN, date of birth, ID band Patient awake    Reviewed: Allergy & Precautions, NPO status , Patient's Chart, lab work & pertinent test results  History of Anesthesia Complications Negative for: history of anesthetic complications  Airway Mallampati: III  TM Distance: >3 FB Neck ROM: Full    Dental  (+) Dental Advisory Given, Caps Crowns :   Pulmonary asthma , pneumonia, resolved,    Pulmonary exam normal breath sounds clear to auscultation       Cardiovascular Exercise Tolerance: Good hypertension, Pt. on medications Normal cardiovascular exam Rhythm:Regular Rate:Normal     Neuro/Psych PSYCHIATRIC DISORDERS Anxiety Depression    GI/Hepatic negative GI ROS, Neg liver ROS,   Endo/Other  diabetes, Well Controlled, Type 2, Oral Hypoglycemic Agents  Renal/GU Renal disease     Musculoskeletal negative musculoskeletal ROS (+)   Abdominal   Peds  Hematology negative hematology ROS (+)   Anesthesia Other Findings   Reproductive/Obstetrics negative OB ROS                             Anesthesia Physical Anesthesia Plan  ASA: II  Anesthesia Plan: General   Post-op Pain Management:    Induction: Intravenous  PONV Risk Score and Plan: Propofol infusion  Airway Management Planned: Simple Face Mask  Additional Equipment:   Intra-op Plan:   Post-operative Plan:   Informed Consent: I have reviewed the patients History and Physical, chart, labs and discussed the procedure including the risks, benefits and alternatives for the proposed anesthesia with the patient or authorized representative who has indicated his/her understanding and acceptance.       Plan Discussed with: CRNA and Surgeon  Anesthesia Plan Comments:         Anesthesia Quick Evaluation

## 2021-01-09 NOTE — Anesthesia Postprocedure Evaluation (Signed)
Anesthesia Post Note  Patient: Douglas Erickson  Procedure(s) Performed: COLONOSCOPY WITH PROPOFOL (N/A ) POLYPECTOMY  Patient location during evaluation: Short Stay Anesthesia Type: General Level of consciousness: awake and alert Pain management: pain level controlled Vital Signs Assessment: post-procedure vital signs reviewed and stable Respiratory status: spontaneous breathing Cardiovascular status: blood pressure returned to baseline and stable Postop Assessment: no apparent nausea or vomiting Anesthetic complications: no   No complications documented.   Last Vitals:  Vitals:   01/09/21 0827 01/09/21 0948  BP: (!) 146/87 121/73  Pulse:  (!) 57  Resp: 16 18  Temp: 36.7 C 36.8 C  SpO2: 96% 98%    Last Pain:  Vitals:   01/09/21 0948  TempSrc: Oral  PainSc: 0-No pain                 Naila Elizondo

## 2021-01-09 NOTE — H&P (Signed)
Douglas Erickson is an 75 y.o. male.   Chief Complaint: Patient is here for colonoscopy. HPI: Patient is 75 year old Caucasian male who had Cologuard testing for screening and it came back positive.  He has no GI symptoms.  He denies abdominal pain change in bowel habits rectal bleeding or melena.  He has good appetite and his weight has been stable. He takes 1 OTC Advil every morning. Last colonoscopy was normal 12 years ago. Family history is negative for colorectal carcinoma.  Past Medical History:  Diagnosis Date  . Anxiety   . Asthma   . Depression   . Diabetes mellitus without complication ([place])   . High cholesterol   . Hypertension   . Kidney disease   . Pneumonia     Past Surgical History:  Procedure Laterality Date  . NASAL RECONSTRUCTION    . TONSILLECTOMY    . VASECTOMY    . VASECTOMY REVERSAL      Family History  Problem Relation Age of Onset  . Healthy Mother   . COPD Father    Social History:  reports that he has never smoked. He has never used smokeless tobacco. He reports previous alcohol use. He reports that he does not use drugs.  Allergies: No Known Allergies  Medications Prior to Admission  Medication Sig Dispense Refill  . albuterol (VENTOLIN HFA) 108 (90 Base) MCG/ACT inhaler Inhale 2 puffs into the lungs every 6 (six) hours as needed for shortness of breath.    . Apple Cid Vn-Grn Tea-Bit Or-Cr (APPLE CIDER VINEGAR PLUS PO) Take 10 mLs by mouth daily as needed (Indigestion). Mix with water    . Ascorbic Acid (VITAMIN C PO) Take 200 mg by mouth daily.    . calcipotriene (DOVONOX) 0.005 % cream Apply 1 application topically daily.    . cetirizine (ZYRTEC) 10 MG tablet Take 10 mg by mouth daily.    . Coenzyme Q10 (CO Q 10) 100 MG CAPS Take 100 mg by mouth at bedtime.    . Cyanocobalamin (VITAMIN B 12) 100 MCG LOZG Take 1,000 mg by mouth daily.    Marland Kitchen FLUoxetine (PROZAC) 40 MG capsule Take 40 mg by mouth daily.    . Garlic 2979 MG CAPS Take 2,000 mg by mouth  daily.    Marland Kitchen ibuprofen (ADVIL) 200 MG tablet Take 200-400 mg by mouth every 6 (six) hours as needed for fever or moderate pain.    Marland Kitchen ipratropium (ATROVENT) 0.03 % nasal spray Place 1 spray into both nostrils daily as needed for congestion.    Marland Kitchen MAGNESIUM CITRATE PO Take 100 mg by mouth in the morning and at bedtime.    . Milk Thistle 1000 MG CAPS Take 1,000 mg by mouth daily.    . Misc Natural Products (PROSTATE SUPPORT PO) Take 1,000 mg by mouth in the morning and at bedtime.    . Multiple Vitamin (MULTIVITAMIN) capsule Take 1 capsule by mouth at bedtime.    . Omega-3 Fatty Acids (FISH OIL PO) Take 2,400 mg by mouth in the morning and at bedtime.    . polyethylene glycol-electrolytes (TRILYTE) 420 g solution Take 4,000 mLs by mouth as directed. 4000 mL 0  . Potassium 99 MG TABS Take 99 mg by mouth at bedtime.    . simvastatin (ZOCOR) 40 MG tablet Take 20 mg by mouth at bedtime.    . Skin Protectants, Misc. (EUCERIN) cream Apply 1 application topically at bedtime.      Results for orders placed or  performed during the hospital encounter of 01/09/21 (from the past 48 hour(s))  Glucose, capillary     Status: Abnormal   Collection Time: 01/09/21  8:02 AM  Result Value Ref Range   Glucose-Capillary 100 (H) 70 - 99 mg/dL    Comment: Glucose reference range applies only to samples taken after fasting for at least 8 hours.   No results found.  Review of Systems  There were no vitals taken for this visit. Physical Exam HENT:     Mouth/Throat:     Mouth: Mucous membranes are moist.     Pharynx: Oropharynx is clear.  Eyes:     General: No scleral icterus.    Conjunctiva/sclera: Conjunctivae normal.  Cardiovascular:     Rate and Rhythm: Normal rate and regular rhythm.     Heart sounds: Normal heart sounds. No murmur heard.   Pulmonary:     Effort: Pulmonary effort is normal.     Breath sounds: Normal breath sounds.  Abdominal:     Comments: Abdomen is symmetrical.  On palpation is  soft.  Right rectus abdominal is prominent in the left lung.  No organomegaly or masses.  Musculoskeletal:        General: No swelling.     Cervical back: Neck supple.  Lymphadenopathy:     Cervical: No cervical adenopathy.  Skin:    General: Skin is warm and dry.  Neurological:     Mental Status: He is alert.      Assessment/Plan  Positive Cologuard test. Diagnostic colonoscopy.  Hildred Laser, MD 01/09/2021, 8:21 AM

## 2021-01-10 LAB — SURGICAL PATHOLOGY

## 2021-01-14 ENCOUNTER — Encounter (HOSPITAL_COMMUNITY): Payer: Self-pay | Admitting: Internal Medicine

## 2021-01-22 DIAGNOSIS — I10 Essential (primary) hypertension: Secondary | ICD-10-CM | POA: Diagnosis not present

## 2021-01-22 DIAGNOSIS — Z299 Encounter for prophylactic measures, unspecified: Secondary | ICD-10-CM | POA: Diagnosis not present

## 2021-01-22 DIAGNOSIS — F32A Depression, unspecified: Secondary | ICD-10-CM | POA: Diagnosis not present

## 2021-01-22 DIAGNOSIS — E1165 Type 2 diabetes mellitus with hyperglycemia: Secondary | ICD-10-CM | POA: Diagnosis not present

## 2021-03-08 ENCOUNTER — Observation Stay (HOSPITAL_COMMUNITY)
Admission: EM | Admit: 2021-03-08 | Discharge: 2021-03-10 | Disposition: A | Discharge status: Home / Self Care | Payer: Medicare Other | Attending: Internal Medicine | Admitting: Internal Medicine

## 2021-03-08 ENCOUNTER — Emergency Department (HOSPITAL_COMMUNITY): Payer: Medicare Other

## 2021-03-08 ENCOUNTER — Other Ambulatory Visit: Payer: Self-pay

## 2021-03-08 DIAGNOSIS — H3562 Retinal hemorrhage, left eye: Secondary | ICD-10-CM | POA: Diagnosis not present

## 2021-03-08 DIAGNOSIS — H35033 Hypertensive retinopathy, bilateral: Secondary | ICD-10-CM | POA: Diagnosis not present

## 2021-03-08 DIAGNOSIS — G453 Amaurosis fugax: Secondary | ICD-10-CM | POA: Diagnosis not present

## 2021-03-08 DIAGNOSIS — E119 Type 2 diabetes mellitus without complications: Secondary | ICD-10-CM | POA: Diagnosis not present

## 2021-03-08 DIAGNOSIS — Z20822 Contact with and (suspected) exposure to covid-19: Secondary | ICD-10-CM | POA: Diagnosis not present

## 2021-03-08 DIAGNOSIS — F418 Other specified anxiety disorders: Secondary | ICD-10-CM | POA: Diagnosis present

## 2021-03-08 DIAGNOSIS — I10 Essential (primary) hypertension: Secondary | ICD-10-CM | POA: Diagnosis present

## 2021-03-08 DIAGNOSIS — J45909 Unspecified asthma, uncomplicated: Secondary | ICD-10-CM | POA: Insufficient documentation

## 2021-03-08 DIAGNOSIS — E785 Hyperlipidemia, unspecified: Secondary | ICD-10-CM | POA: Diagnosis present

## 2021-03-08 DIAGNOSIS — G459 Transient cerebral ischemic attack, unspecified: Secondary | ICD-10-CM | POA: Diagnosis not present

## 2021-03-08 DIAGNOSIS — H539 Unspecified visual disturbance: Secondary | ICD-10-CM

## 2021-03-08 DIAGNOSIS — H53132 Sudden visual loss, left eye: Secondary | ICD-10-CM | POA: Diagnosis not present

## 2021-03-08 LAB — DIFFERENTIAL
Abs Immature Granulocytes: 0.03 10*3/uL (ref 0.00–0.07)
Basophils Absolute: 0 10*3/uL (ref 0.0–0.1)
Basophils Relative: 0 %
Eosinophils Absolute: 0.3 10*3/uL (ref 0.0–0.5)
Eosinophils Relative: 3 %
Immature Granulocytes: 0 %
Lymphocytes Relative: 19 %
Lymphs Abs: 1.7 10*3/uL (ref 0.7–4.0)
Monocytes Absolute: 0.7 10*3/uL (ref 0.1–1.0)
Monocytes Relative: 7 %
Neutro Abs: 6.4 10*3/uL (ref 1.7–7.7)
Neutrophils Relative %: 71 %

## 2021-03-08 LAB — PROTIME-INR
INR: 1 (ref 0.8–1.2)
Prothrombin Time: 12.7 seconds (ref 11.4–15.2)

## 2021-03-08 LAB — I-STAT CHEM 8, ED
BUN: 23 mg/dL (ref 8–23)
Calcium, Ion: 1.15 mmol/L (ref 1.15–1.40)
Chloride: 104 mmol/L (ref 98–111)
Creatinine, Ser: 1.1 mg/dL (ref 0.61–1.24)
Glucose, Bld: 127 mg/dL — ABNORMAL HIGH (ref 70–99)
HCT: 43 % (ref 39.0–52.0)
Hemoglobin: 14.6 g/dL (ref 13.0–17.0)
Potassium: 4.1 mmol/L (ref 3.5–5.1)
Sodium: 141 mmol/L (ref 135–145)
TCO2: 24 mmol/L (ref 22–32)

## 2021-03-08 LAB — COMPREHENSIVE METABOLIC PANEL
ALT: 21 U/L (ref 0–44)
AST: 31 U/L (ref 15–41)
Albumin: 4.4 g/dL (ref 3.5–5.0)
Alkaline Phosphatase: 48 U/L (ref 38–126)
Anion gap: 10 (ref 5–15)
BUN: 21 mg/dL (ref 8–23)
CO2: 25 mmol/L (ref 22–32)
Calcium: 9.7 mg/dL (ref 8.9–10.3)
Chloride: 103 mmol/L (ref 98–111)
Creatinine, Ser: 1.14 mg/dL (ref 0.61–1.24)
GFR, Estimated: >60 mL/min (ref >60)
Glucose, Bld: 129 mg/dL — ABNORMAL HIGH (ref 70–99)
Potassium: 4.3 mmol/L (ref 3.5–5.1)
Sodium: 138 mmol/L (ref 135–145)
Total Bilirubin: 0.7 mg/dL (ref 0.3–1.2)
Total Protein: 7.1 g/dL (ref 6.5–8.1)

## 2021-03-08 LAB — CBC
HCT: 43.3 % (ref 39.0–52.0)
Hemoglobin: 14.4 g/dL (ref 13.0–17.0)
MCH: 31.2 pg (ref 26.0–34.0)
MCHC: 33.3 g/dL (ref 30.0–36.0)
MCV: 93.9 fL (ref 80.0–100.0)
Platelets: 250 10*3/uL (ref 150–400)
RBC: 4.61 MIL/uL (ref 4.22–5.81)
RDW: 13.9 % (ref 11.5–15.5)
WBC: 9.1 10*3/uL (ref 4.0–10.5)
nRBC: 0 % (ref 0.0–0.2)

## 2021-03-08 LAB — RESP PANEL BY RT-PCR (FLU A&B, COVID) ARPGX2
Influenza A by PCR: NEGATIVE
Influenza B by PCR: NEGATIVE
SARS Coronavirus 2 by RT PCR: NEGATIVE

## 2021-03-08 LAB — CBG MONITORING, ED: Glucose-Capillary: 119 mg/dL — ABNORMAL HIGH (ref 70–99)

## 2021-03-08 LAB — APTT: aPTT: 29 seconds (ref 24–36)

## 2021-03-08 IMAGING — MR MR MRA HEAD W/O CM
1 series · 23 of 48 positions shown · IV contrast (gadavist)
Comparison: None.

CLINICAL DATA: Multiple episodes of left vision loss

EXAM:
MRI HEAD WITHOUT CONTRAST
MRA HEAD WITHOUT CONTRAST
MRA NECK WITHOUT AND WITH CONTRAST
TECHNIQUE: Multiplanar, multi-echo pulse sequences of the brain and surrounding
structures were acquired without intravenous contrast. Angiographic
images of the Circle of Willis were acquired using MRA technique
without intravenous contrast. Angiographic images of the neck were
acquired using MRA technique without and with intravenous contrast.
Carotid stenosis measurements (when applicable) are obtained
utilizing NASCET criteria, using the distal internal carotid
diameter as the denominator.
CONTRAST:  8.1mL GADAVIST GADOBUTROL 1 MMOL/ML IV SOLN

[Series 5: 3d cow · axial · 0.5mm · 0.41mm/px · z∈[-83,+5]mm · 23 of 188 slices shown]
[im 1/188]
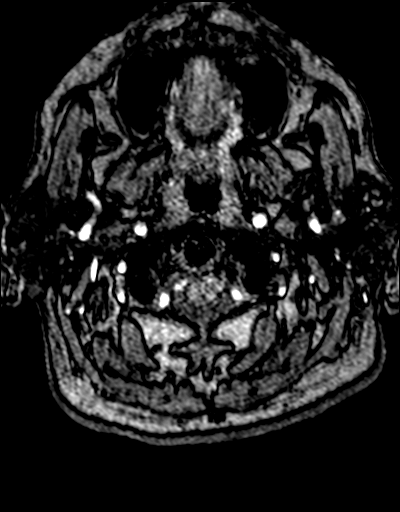
[im 4/188]
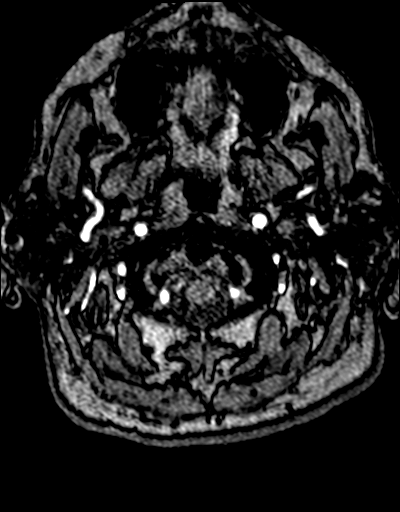
[im 8/188]
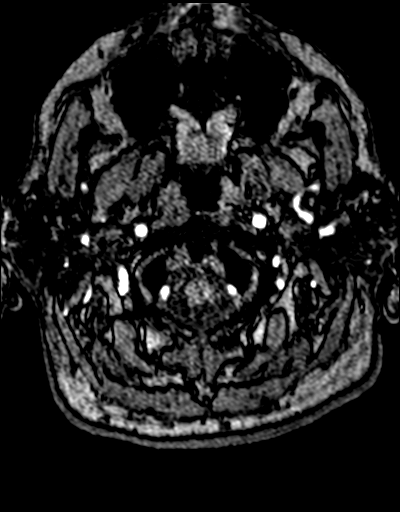
[im 12/188]
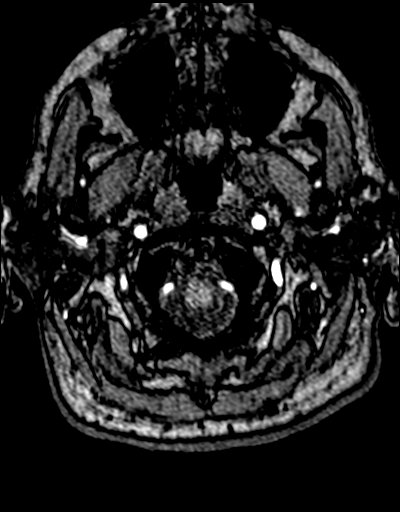
[im 16/188]
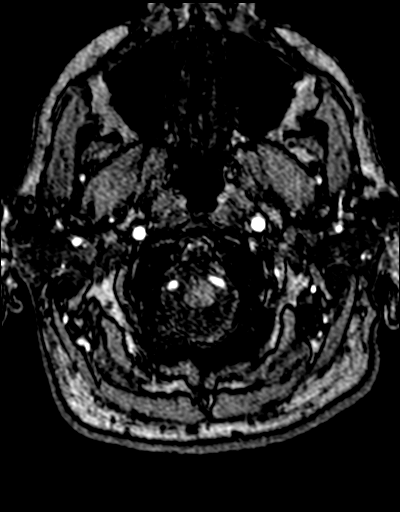
[im 20/188]
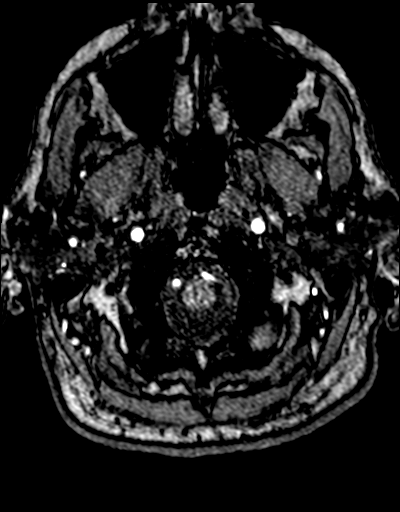
[im 24/188]
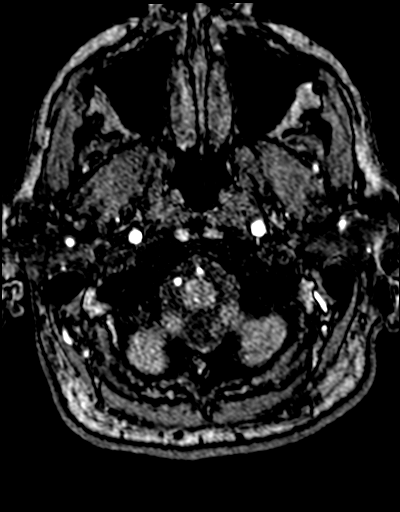
[im 28/188]
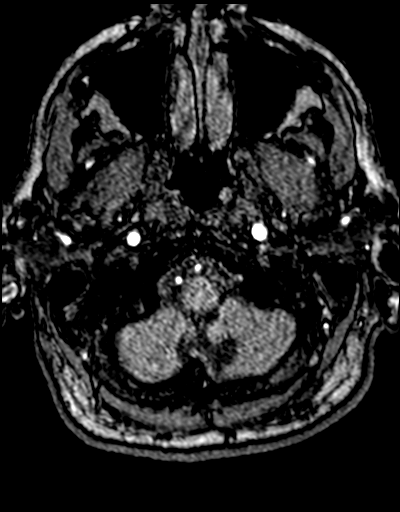
[im 32/188]
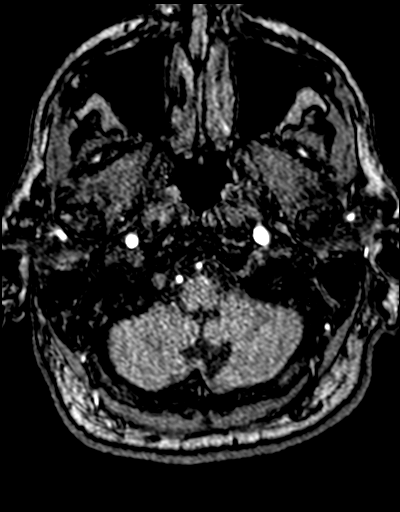
[im 36/188]
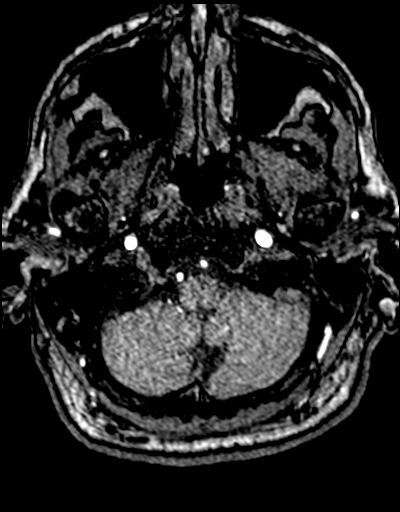
[im 40/188]
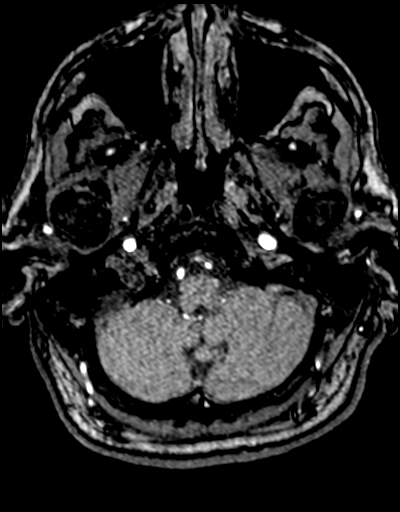
[im 44/188]
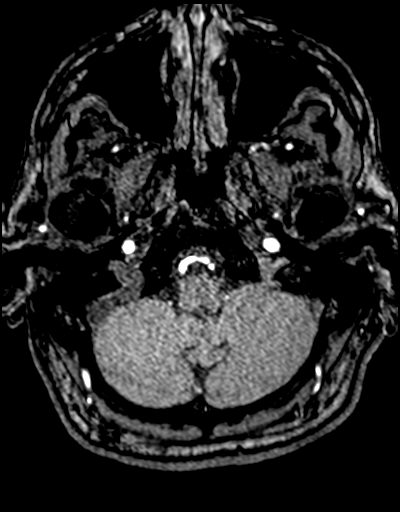
[im 48/188]
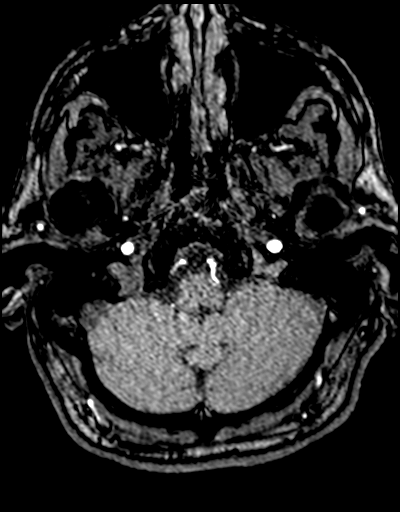
[im 52/188]
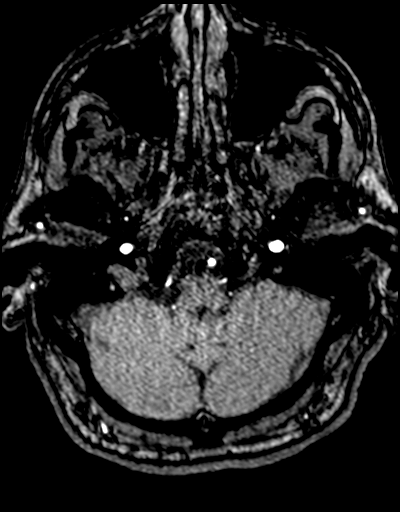
[im 56/188]
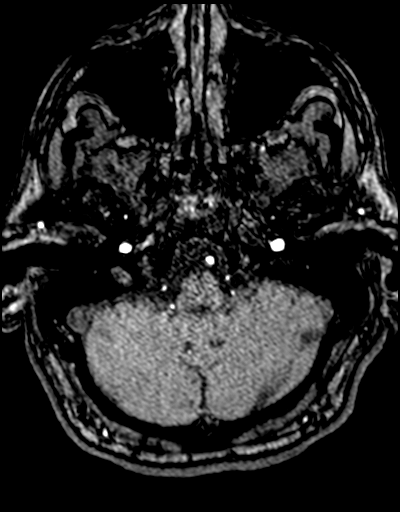
[im 60/188]
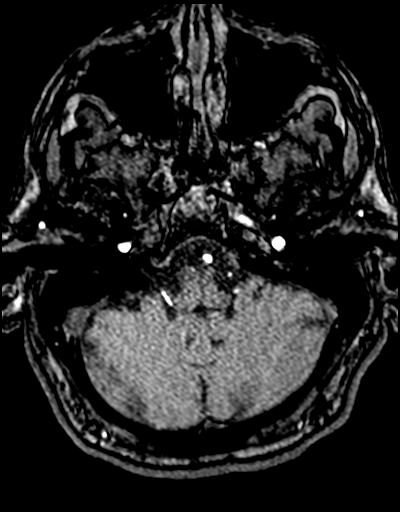
[im 84/188]
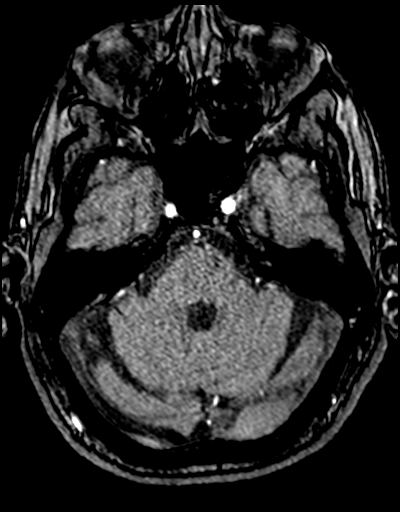
[im 96/188]
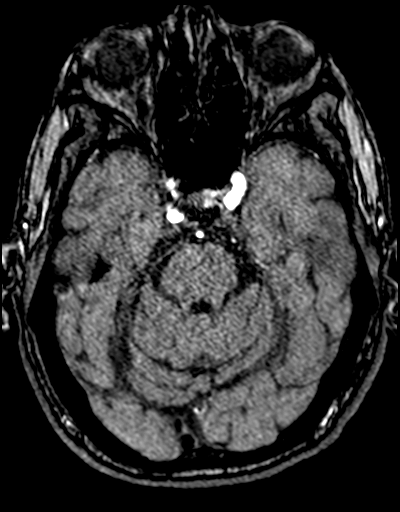
[im 108/188]
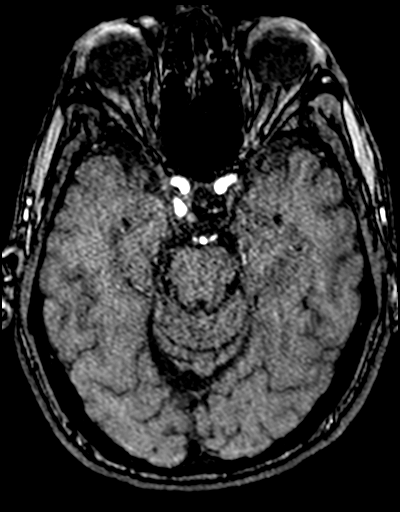
[im 132/188]
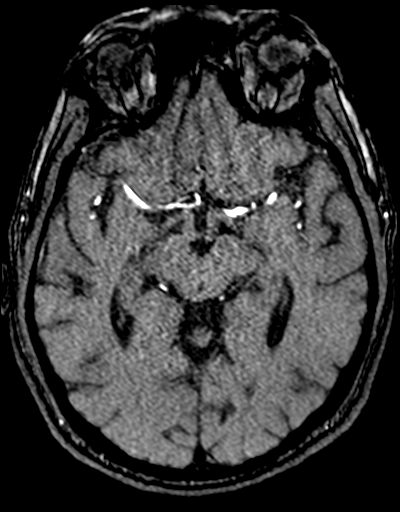
[im 156/188]
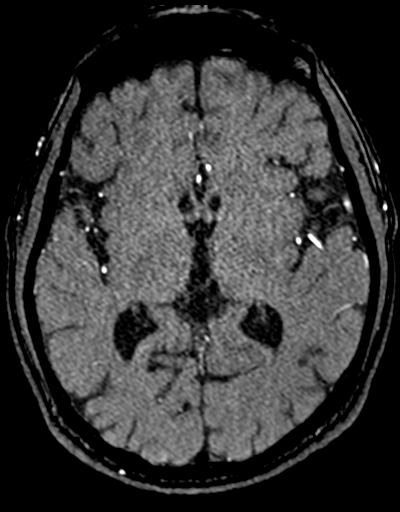
[im 160/188]
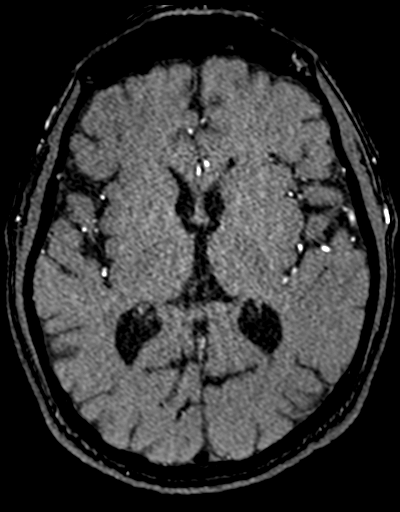
[im 180/188]
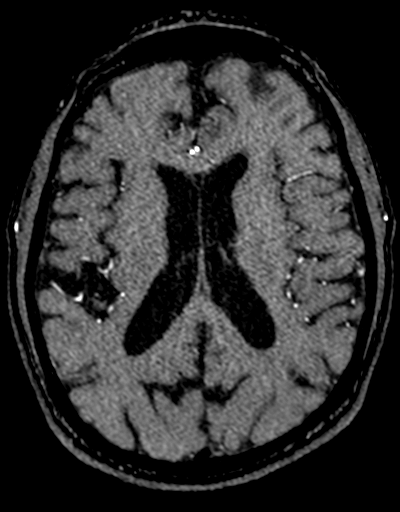

[23 of 48 positions shown; findings below may reference images not displayed]

FINDINGS: MRI HEAD

Brain: There is no acute infarction or intracranial hemorrhage.
There is no intracranial mass, mass effect, or edema. There is no
hydrocephalus or extra-axial fluid collection. Prominence of the
ventricles and sulci reflects mild generalized parenchymal volume
loss. Patchy foci of T2 hyperintensity in the supratentorial white
matter nonspecific may reflect mild chronic microvascular ischemic
changes.

Vascular: Major vessel flow voids at the skull base are preserved.

Skull and upper cervical spine: Normal marrow signal is preserved.

Sinuses/Orbits: Minor mucosal thickening. Bilateral lens
replacements.

Other: Sella is partially empty.  Mastoid air cells are clear.

MRA HEAD

Intracranial internal carotid arteries are patent. Middle and
anterior cerebral arteries are patent. Intracranial vertebral
arteries, basilar artery, posterior cerebral arteries are patent.
Right posterior communicating artery is present with fetal origin of
the right PCA. There is no significant stenosis or aneurysm.

MRA NECK

Great vessel origins are patent. There is no proximal subclavian
artery stenosis. Common, internal, and external carotid arteries are
patent. There is no stenosis or evidence of dissection.

Codominant vertebral arteries are patent. No stenosis or evidence of
dissection.
IMPRESSION: No acute infarction, hemorrhage, or mass. Mild chronic microvascular
ischemic changes.

No proximal intracranial vessel occlusion or significant stenosis.

No large vessel occlusion or hemodynamically significant stenosis in
the neck.

## 2021-03-08 IMAGING — MR MR MRA NECK WO/W CM
7 of 8 series · 43 of 48 positions shown · IV contrast (Gadavist)
Comparison: None.

CLINICAL DATA: Multiple episodes of left vision loss

EXAM:
MRI HEAD WITHOUT CONTRAST
MRA HEAD WITHOUT CONTRAST
MRA NECK WITHOUT AND WITH CONTRAST
TECHNIQUE: Multiplanar, multi-echo pulse sequences of the brain and surrounding
structures were acquired without intravenous contrast. Angiographic
images of the Circle of Willis were acquired using MRA technique
without intravenous contrast. Angiographic images of the neck were
acquired using MRA technique without and with intravenous contrast.
Carotid stenosis measurements (when applicable) are obtained
utilizing NASCET criteria, using the distal internal carotid
diameter as the denominator.
CONTRAST:  8.1mL GADAVIST GADOBUTROL 1 MMOL/ML IV SOLN

[Series 10: tof_fl3d_tra_iso · axial · 0.6mm · 0.52mm/px · z∈[-189,-103]mm · 8 of 146 slices shown]
[im 1/146]
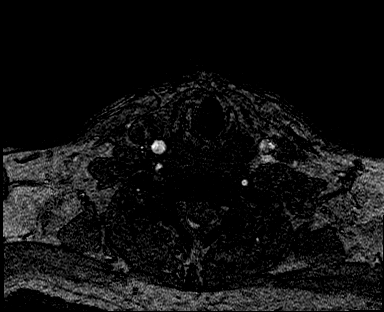
[im 21/146]
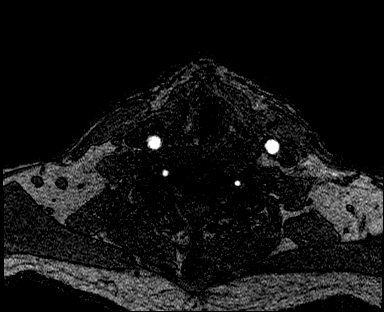
[im 42/146]
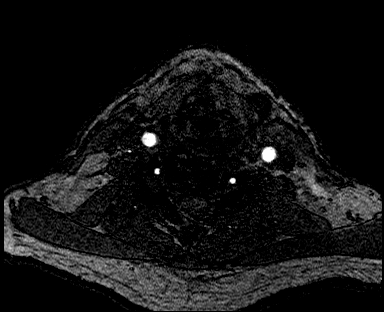
[im 63/146]
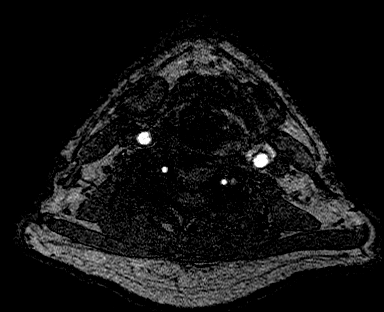
[im 83/146]
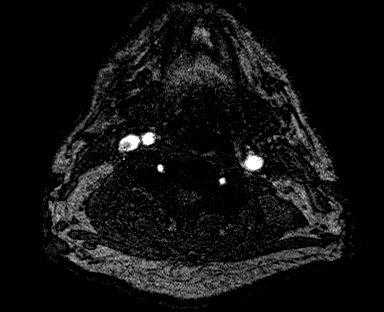
[im 104/146]
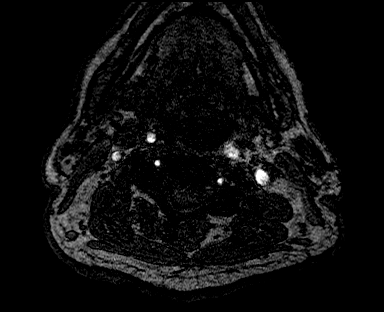
[im 125/146]
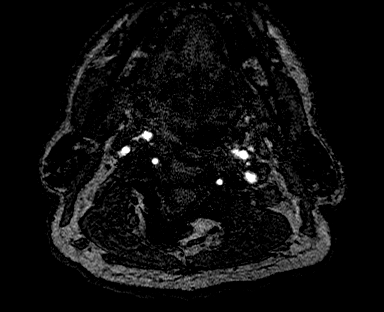
[im 146/146]
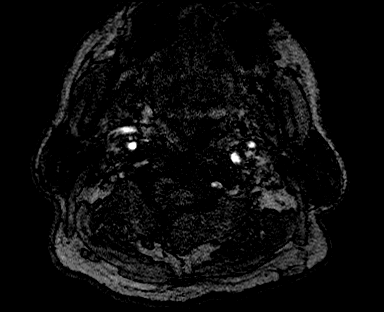

[Series 13: angio_fl3d_cor_pre_ttc=3.0s · coronal · 0.9mm · 0.85mm/px · 5 of 88 slices shown]
[im 1/88]
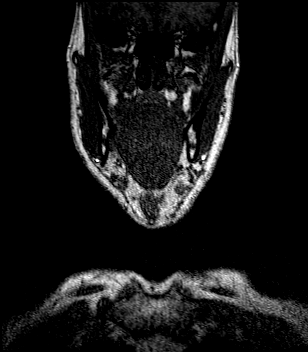
[im 22/88]
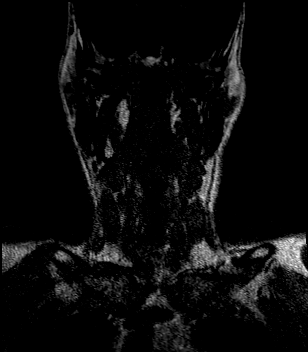
[im 44/88]
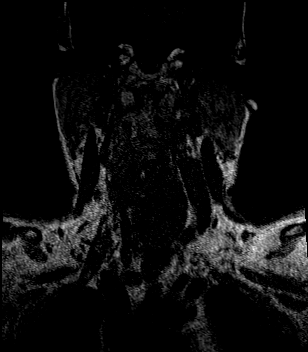
[im 66/88]
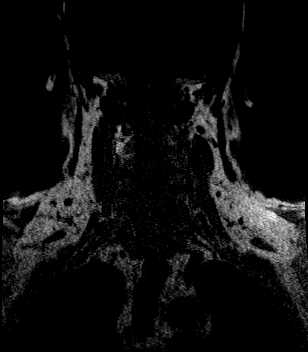
[im 88/88]
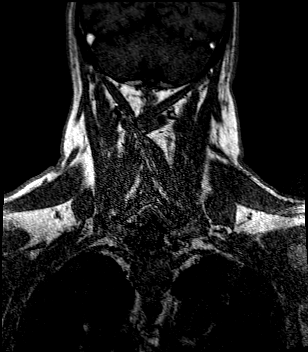

[Series 15: angio_fl3d_cor_post_ttc=3.0s · coronal · 0.9mm · 0.85mm/px · 6 of 88 slices shown (1 of 2)]
[im 1/88]
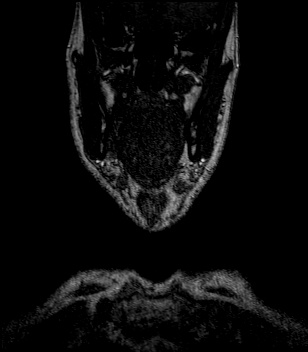
[im 18/88]
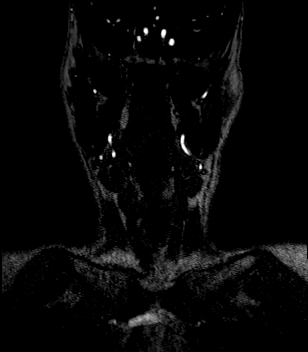
[im 35/88]
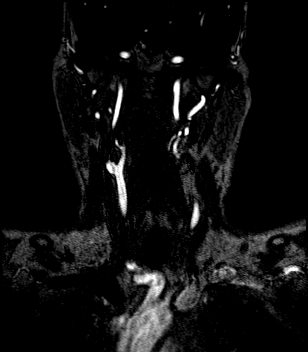
[im 53/88]
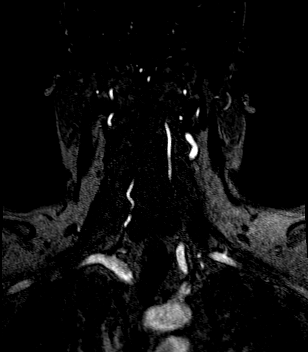
[im 70/88]
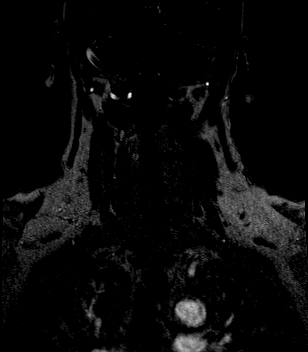
[im 88/88]
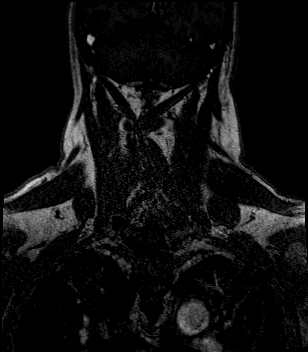

[Series 16: angio_fl3d_cor_post_ttc=3.0s_moco-adv · coronal · 0.9mm · 0.85mm/px · 6 of 88 slices shown (1 of 2)]
[im 1/88]
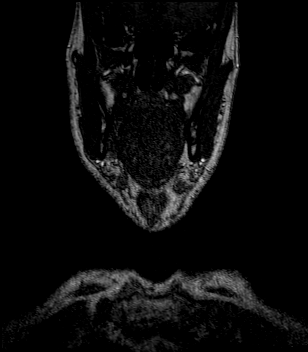
[im 18/88]
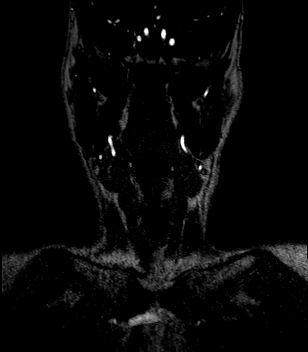
[im 35/88]
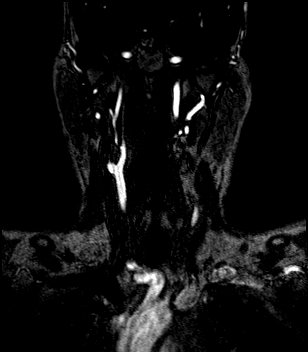
[im 53/88]
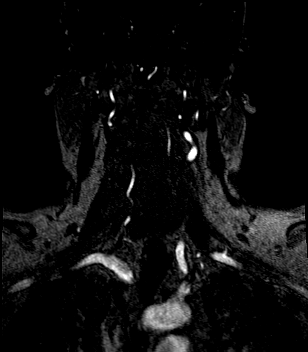
[im 70/88]
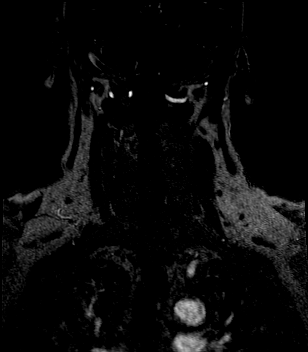
[im 88/88]
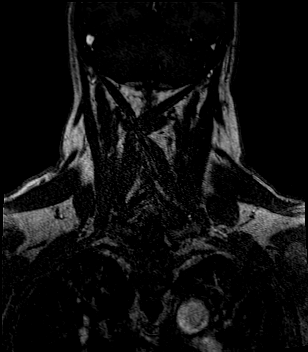

[Series 17: angio_fl3d_cor_post_ttc=3.0s_moco-adv_sub · coronal · 0.9mm · 0.85mm/px · 6 of 88 slices shown]
[im 1/88]
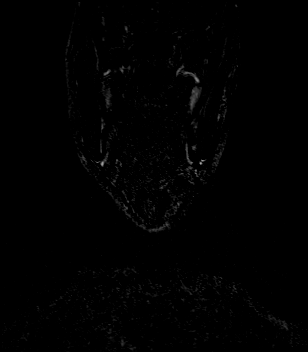
[im 18/88]
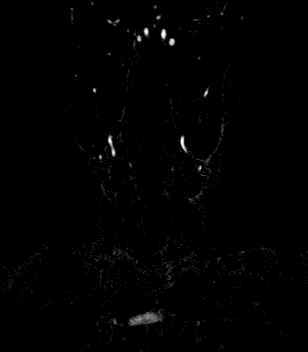
[im 35/88]
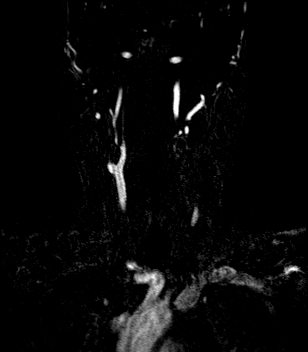
[im 53/88]
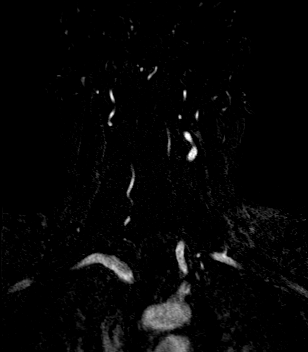
[im 70/88]
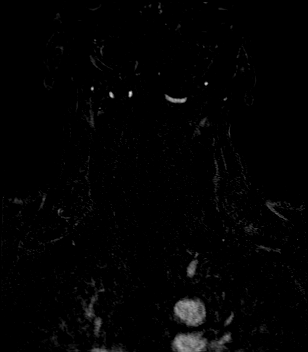
[im 88/88]
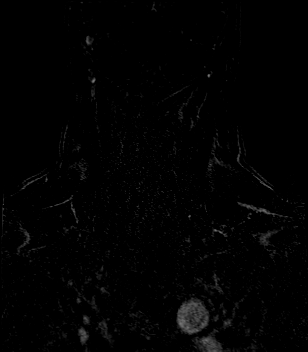

[Series 19: angio_fl3d_cor_post_ttc=3.0s · coronal · 0.9mm · 0.85mm/px · 6 of 88 slices shown (2 of 2)]
[im 1/88]
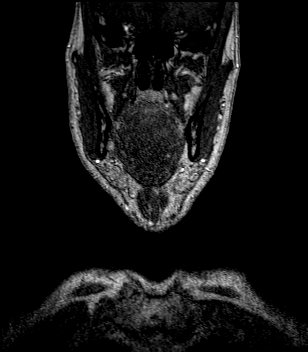
[im 18/88]
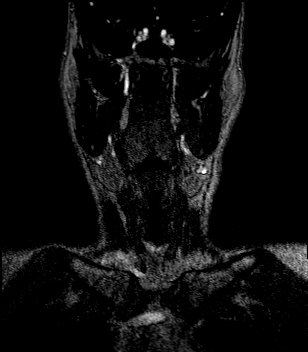
[im 35/88]
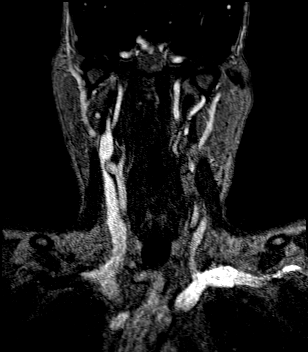
[im 53/88]
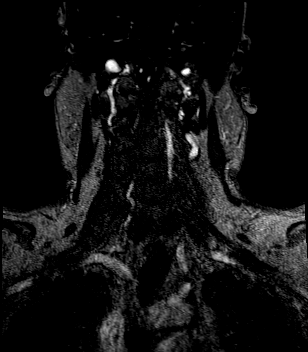
[im 70/88]
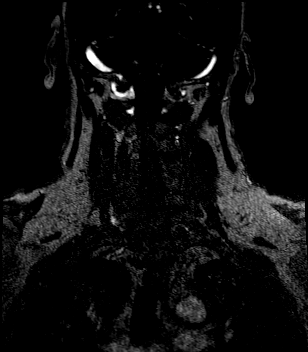
[im 88/88]
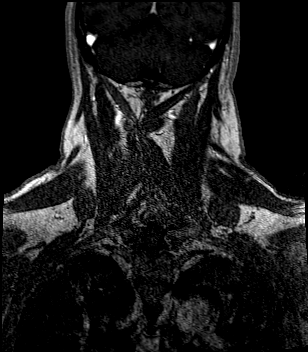

[Series 20: angio_fl3d_cor_post_ttc=3.0s_moco-adv · coronal · 0.9mm · 0.85mm/px · 6 of 88 slices shown (2 of 2)]
[im 1/88]
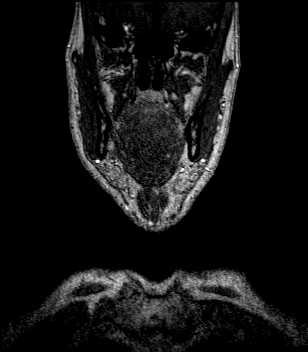
[im 18/88]
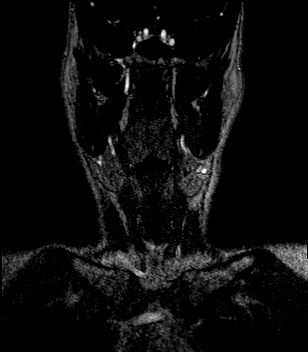
[im 35/88]
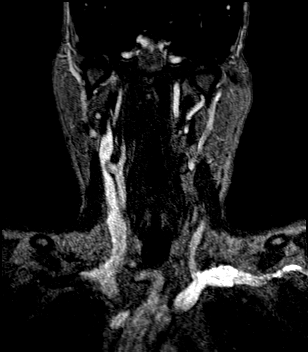
[im 53/88]
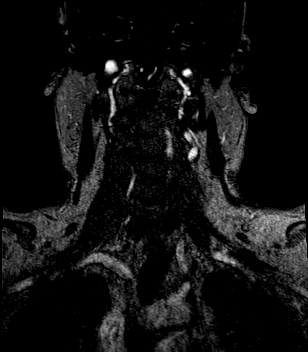
[im 70/88]
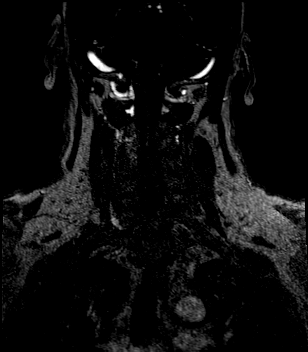
[im 88/88]
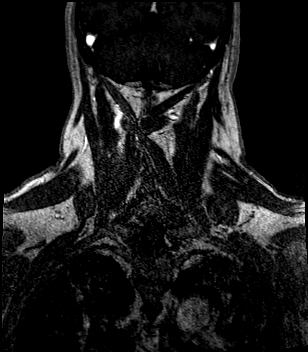

[43 of 48 positions shown; findings below may reference images not displayed]

FINDINGS: MRI HEAD

Brain: There is no acute infarction or intracranial hemorrhage.
There is no intracranial mass, mass effect, or edema. There is no
hydrocephalus or extra-axial fluid collection. Prominence of the
ventricles and sulci reflects mild generalized parenchymal volume
loss. Patchy foci of T2 hyperintensity in the supratentorial white
matter nonspecific may reflect mild chronic microvascular ischemic
changes.

Vascular: Major vessel flow voids at the skull base are preserved.

Skull and upper cervical spine: Normal marrow signal is preserved.

Sinuses/Orbits: Minor mucosal thickening. Bilateral lens
replacements.

Other: Sella is partially empty.  Mastoid air cells are clear.

MRA HEAD

Intracranial internal carotid arteries are patent. Middle and
anterior cerebral arteries are patent. Intracranial vertebral
arteries, basilar artery, posterior cerebral arteries are patent.
Right posterior communicating artery is present with fetal origin of
the right PCA. There is no significant stenosis or aneurysm.

MRA NECK

Great vessel origins are patent. There is no proximal subclavian
artery stenosis. Common, internal, and external carotid arteries are
patent. There is no stenosis or evidence of dissection.

Codominant vertebral arteries are patent. No stenosis or evidence of
dissection.
IMPRESSION: No acute infarction, hemorrhage, or mass. Mild chronic microvascular
ischemic changes.

No proximal intracranial vessel occlusion or significant stenosis.

No large vessel occlusion or hemodynamically significant stenosis in
the neck.

## 2021-03-08 IMAGING — MR MR HEAD W/O CM
12 of 13 series · 44 of 48 positions shown · IV contrast (gadavist)
Comparison: None.

CLINICAL DATA: Multiple episodes of left vision loss

EXAM:
MRI HEAD WITHOUT CONTRAST
MRA HEAD WITHOUT CONTRAST
MRA NECK WITHOUT AND WITH CONTRAST
TECHNIQUE: Multiplanar, multi-echo pulse sequences of the brain and surrounding
structures were acquired without intravenous contrast. Angiographic
images of the Circle of Willis were acquired using MRA technique
without intravenous contrast. Angiographic images of the neck were
acquired using MRA technique without and with intravenous contrast.
Carotid stenosis measurements (when applicable) are obtained
utilizing NASCET criteria, using the distal internal carotid
diameter as the denominator.
CONTRAST:  8.1mL GADAVIST GADOBUTROL 1 MMOL/ML IV SOLN

[Series 5: DWI · axial · 3.0mm · 0.88mm/px · z∈[-82,+70]mm · 7 of 104 slices shown (1 of 4)]
[im 1/104]
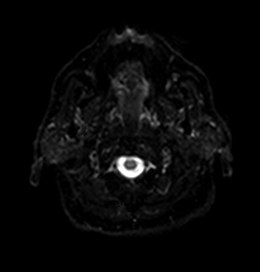
[im 18/104]
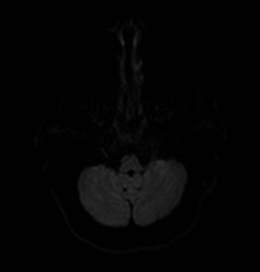
[im 35/104]
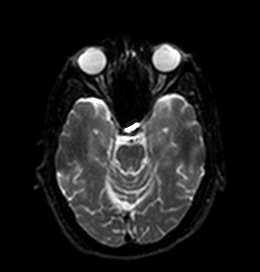
[im 52/104]
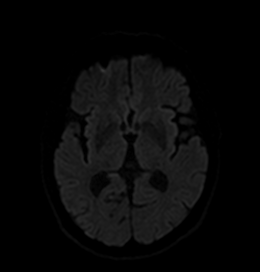
[im 69/104]
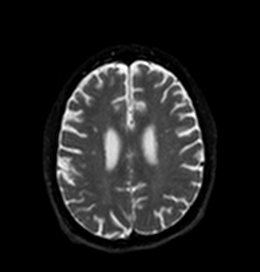
[im 86/104]
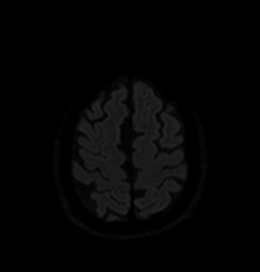
[im 104/104]
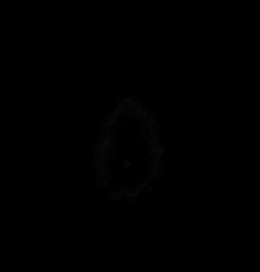

[Series 6: DWI · axial · 3.0mm · 0.88mm/px · z∈[-82,+70]mm · 4 of 51 slices shown (2 of 4)]
[im 1/51]
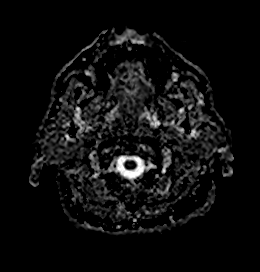
[im 17/51]
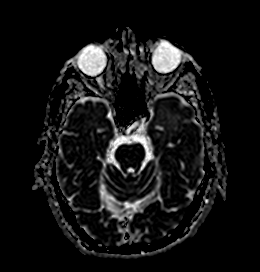
[im 34/51]
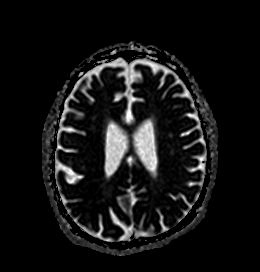
[im 51/51]
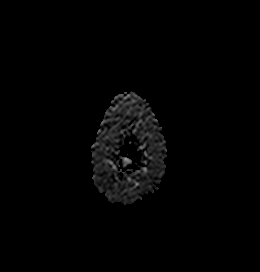

[Series 7: DWI · coronal · 4.0mm · 0.88mm/px · 6 of 74 slices shown (3 of 4)]
[im 1/74]
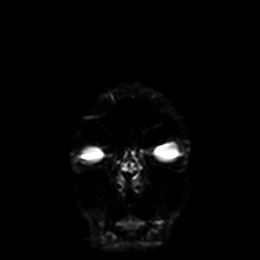
[im 15/74]
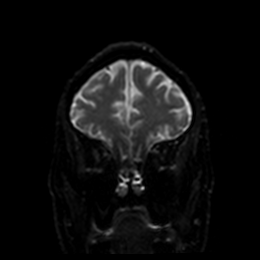
[im 30/74]
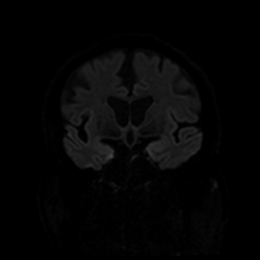
[im 44/74]
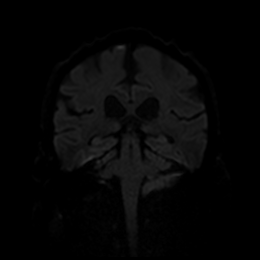
[im 59/74]
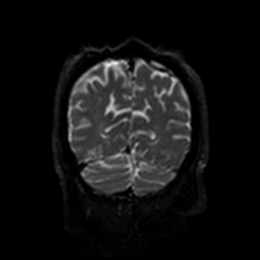
[im 74/74]
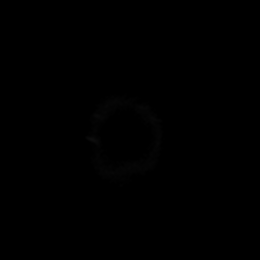

[Series 8: DWI · coronal · 4.0mm · 0.88mm/px · 3 of 36 slices shown (4 of 4)]
[im 1/36]
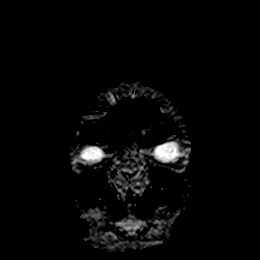
[im 18/36]
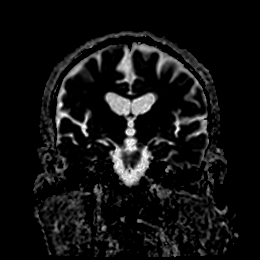
[im 36/36]
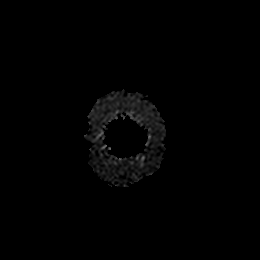

[Series 9: T1 · sagittal · 5.0mm · 0.75mm/px · 2 of 25 slices shown]
[im 1/25]
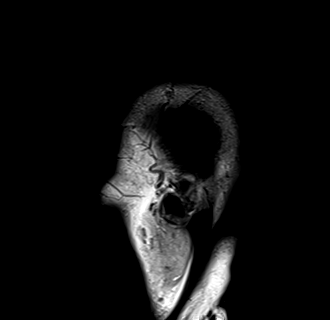
[im 25/25]
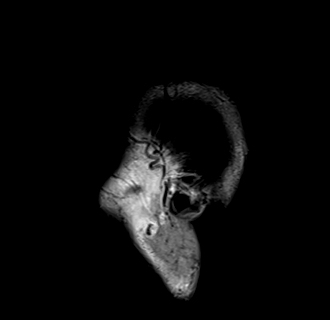

[Series 10: T2 · axial · 5.0mm · 0.72mm/px · z∈[-75,+67]mm · 2 of 25 slices shown (1 of 2)]
[im 1/25]
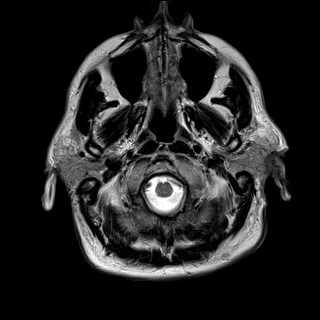
[im 25/25]
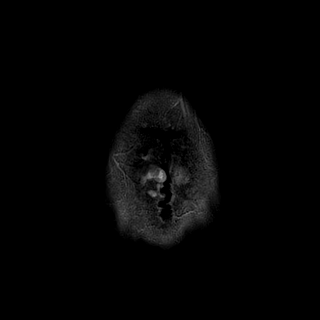

[Series 11: FLAIR · axial · 5.0mm · 0.45mm/px · z∈[-77,+65]mm · 2 of 25 slices shown]
[im 1/25]
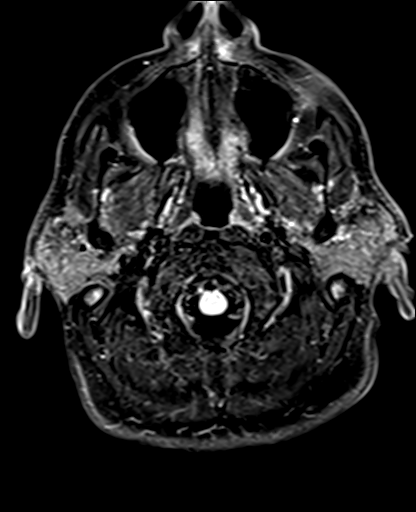
[im 25/25]
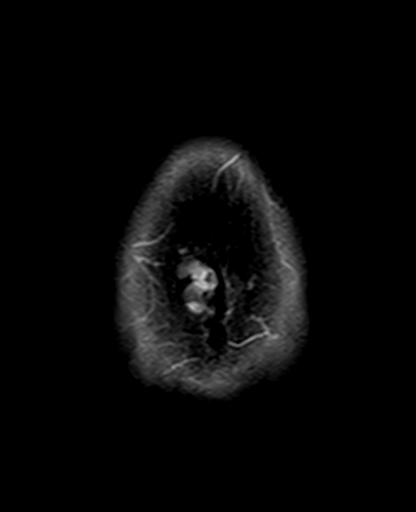

[Series 12: mag_images · axial · 3.0mm · 0.90mm/px · z∈[-87,+76]mm · 4 of 56 slices shown]
[im 1/56]
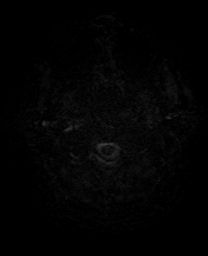
[im 19/56]
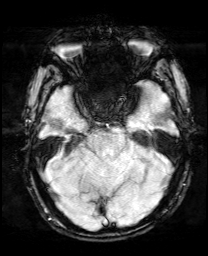
[im 37/56]
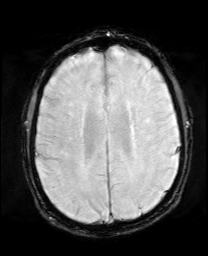
[im 56/56]
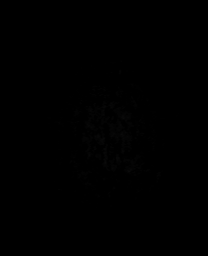

[Series 13: pha_images · axial · 3.0mm · 0.90mm/px · z∈[-87,+76]mm · 4 of 54 slices shown]
[im 1/54]
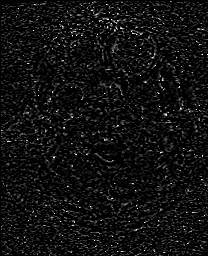
[im 18/54]
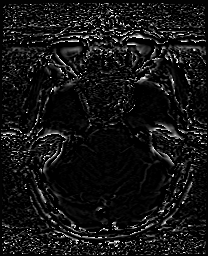
[im 36/54]
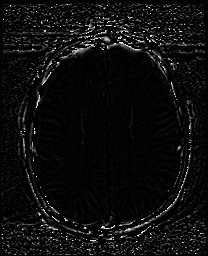
[im 54/54]
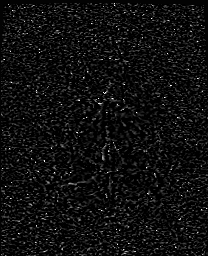

[Series 14: swi_images · axial · 3.0mm · 0.90mm/px · z∈[-87,+76]mm · 4 of 56 slices shown]
[im 1/56]
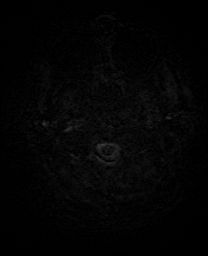
[im 19/56]
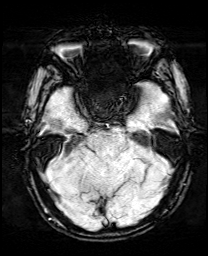
[im 37/56]
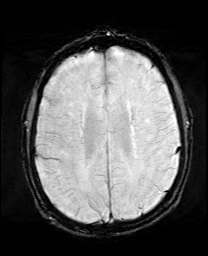
[im 56/56]
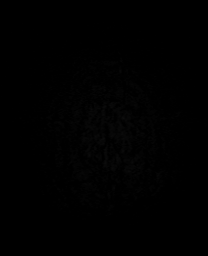

[Series 15: mip_images(sw) · axial · 24.0mm · 0.90mm/px · z∈[-77,+65]mm · 4 of 49 slices shown]
[im 1/49]
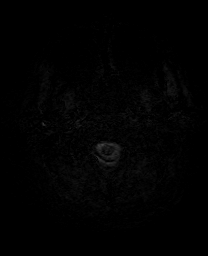
[im 17/49]
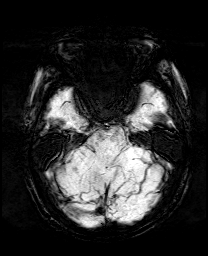
[im 33/49]
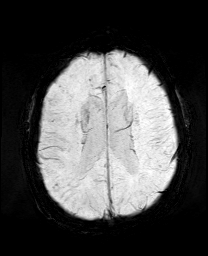
[im 49/49]
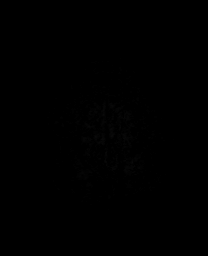

[Series 17: T2 · coronal · 5.0mm · 0.34mm/px · 2 of 31 slices shown (2 of 2)]
[im 1/31]
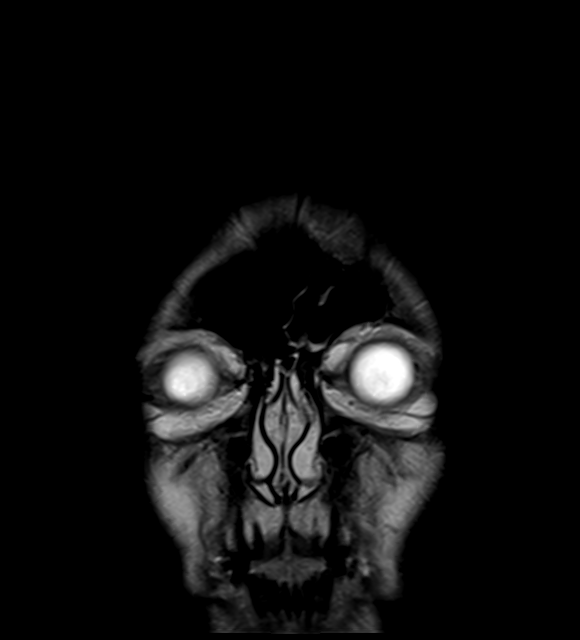
[im 31/31]
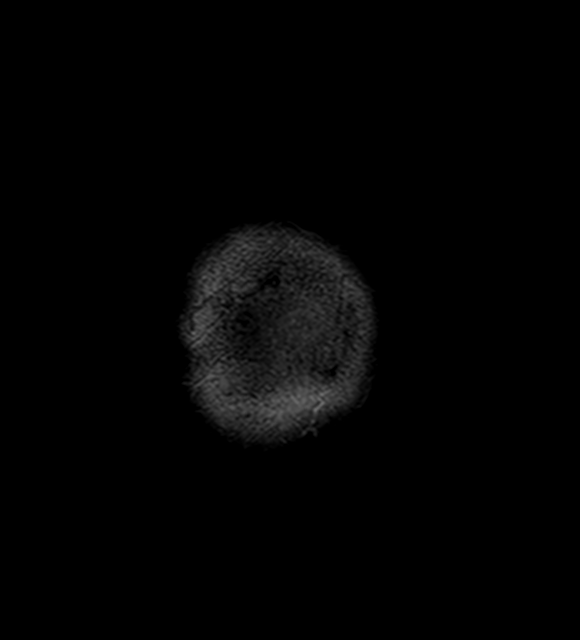

[44 of 48 positions shown; findings below may reference images not displayed]

FINDINGS: MRI HEAD

Brain: There is no acute infarction or intracranial hemorrhage.
There is no intracranial mass, mass effect, or edema. There is no
hydrocephalus or extra-axial fluid collection. Prominence of the
ventricles and sulci reflects mild generalized parenchymal volume
loss. Patchy foci of T2 hyperintensity in the supratentorial white
matter nonspecific may reflect mild chronic microvascular ischemic
changes.

Vascular: Major vessel flow voids at the skull base are preserved.

Skull and upper cervical spine: Normal marrow signal is preserved.

Sinuses/Orbits: Minor mucosal thickening. Bilateral lens
replacements.

Other: Sella is partially empty.  Mastoid air cells are clear.

MRA HEAD

Intracranial internal carotid arteries are patent. Middle and
anterior cerebral arteries are patent. Intracranial vertebral
arteries, basilar artery, posterior cerebral arteries are patent.
Right posterior communicating artery is present with fetal origin of
the right PCA. There is no significant stenosis or aneurysm.

MRA NECK

Great vessel origins are patent. There is no proximal subclavian
artery stenosis. Common, internal, and external carotid arteries are
patent. There is no stenosis or evidence of dissection.

Codominant vertebral arteries are patent. No stenosis or evidence of
dissection.
IMPRESSION: No acute infarction, hemorrhage, or mass. Mild chronic microvascular
ischemic changes.

No proximal intracranial vessel occlusion or significant stenosis.

No large vessel occlusion or hemodynamically significant stenosis in
the neck.

## 2021-03-08 MED ORDER — ATORVASTATIN CALCIUM 40 MG PO TABS
40.0000 mg | ORAL_TABLET | Freq: Every day | ORAL | Status: DC
  Administered 2021-03-09: 40 mg via ORAL
  Filled 2021-03-08: qty 1

## 2021-03-08 MED ORDER — FLUOXETINE HCL 20 MG PO CAPS
40.0000 mg | ORAL_CAPSULE | Freq: Every day | ORAL | Status: DC
  Administered 2021-03-09 – 2021-03-10 (×2): 40 mg via ORAL
  Filled 2021-03-08 (×2): qty 2

## 2021-03-08 MED ORDER — SIMVASTATIN 20 MG PO TABS: 40.0000 mg | ORAL_TABLET | Freq: Every day | ORAL | Status: DC

## 2021-03-08 MED ORDER — SENNOSIDES-DOCUSATE SODIUM 8.6-50 MG PO TABS: 1.0000 | ORAL_TABLET | Freq: Every evening | ORAL | Status: DC | PRN

## 2021-03-08 MED ORDER — GADOBUTROL 1 MMOL/ML IV SOLN
8.1000 mL | Freq: Once | INTRAVENOUS | Status: AC | PRN
  Administered 2021-03-08: 8.1 mL via INTRAVENOUS

## 2021-03-08 MED ORDER — ENOXAPARIN SODIUM 40 MG/0.4ML IJ SOSY
40.0000 mg | PREFILLED_SYRINGE | Freq: Every day | INTRAMUSCULAR | Status: DC
  Administered 2021-03-09: 40 mg via SUBCUTANEOUS
  Filled 2021-03-08 (×2): qty 0.4

## 2021-03-08 MED ORDER — ACETAMINOPHEN 325 MG PO TABS: 650.0000 mg | ORAL_TABLET | ORAL | Status: DC | PRN

## 2021-03-08 MED ORDER — STROKE: EARLY STAGES OF RECOVERY BOOK
Freq: Once | Status: AC
  Filled 2021-03-08: qty 1

## 2021-03-08 MED ORDER — ACETAMINOPHEN 160 MG/5ML PO SOLN: 650.0000 mg | ORAL | Status: DC | PRN

## 2021-03-08 MED ORDER — ACETAMINOPHEN 650 MG RE SUPP: 650.0000 mg | RECTAL | Status: DC | PRN

## 2021-03-08 MED ORDER — CLOPIDOGREL BISULFATE 75 MG PO TABS
75.0000 mg | ORAL_TABLET | Freq: Every day | ORAL | Status: DC
  Administered 2021-03-08 – 2021-03-10 (×3): 75 mg via ORAL
  Filled 2021-03-08 (×3): qty 1

## 2021-03-08 MED ORDER — ALBUTEROL SULFATE (2.5 MG/3ML) 0.083% IN NEBU: 2.5000 mg | INHALATION_SOLUTION | Freq: Four times a day (QID) | RESPIRATORY_TRACT | Status: DC | PRN

## 2021-03-08 NOTE — H&P (Signed)
History and Physical    Douglas Erickson DTO:[id number] DOB: Nov 04, 1945 DOA: 03/08/2021  PCP: Monico Blitz, MD  Patient coming from: Ophthalmology office  I have personally briefly reviewed patient's old medical records in [place]  Chief Complaint: Transient left vision loss  HPI: Douglas Erickson is a 75 y.o. male with medical history significant for hyperlipidemia, depression/anxiety who presented to the ED from his ophthalmology office for TIA/stroke work-up in the setting of amaurosis fugax.  Patient states yesterday (03/07/2021) he was working on his router and struggling with wiring when he suddenly developed a pressure-like sensation in his left eye.  Afterwards he developed transient left visual field loss with complete heart vision.  This occurred initially after he bent over.  Symptoms resolved after about 1 minute.  He says this happened a total of 4 times each time of resolving on its own.  He saw his ophthalmologist, Dr. Monna Fam, earlier today.  Exam was consistent with amaurosis fugax and there was concern for left-sided TIA.  He was given aspirin 325 mg and advised to present to the ED for further evaluation.  Patient otherwise denies any associated lightheadedness/dizziness, headache, diaphoresis, nausea, vomiting, chest pain, palpitations, dyspnea, focal weakness, or numbness/tingling.  He says he was previously on aspirin 81 mg daily however he was advised to discontinue it several months ago.  He has been on simvastatin 20 mg as an outpatient.  He says he was previously on the 40 mg dose however this caused him to experience shakes in his arms and weakness in his legs.  He has not had that issue with the lower 20 mg dose of simvastatin.  ED Course:  Initial vitals showed BP 150/79, pulse 61, RR 19, temp 98.2 F, SPO2 98% on room air.  Labs show WBC 9.1, hemoglobin 14.4, platelets 250,000, sodium 138, potassium 4.3, bicarb 25, BUN 21, creatinine 1.14, serum glucose  129, LFTs within normal limits.  SARS-CoV-2 PCR panel was collected and pending.  MRI brain without contrast, MRA head and neck were obtained.  No acute infarction, hemorrhage, or mass seen.  Mild chronic microvascular ischemic changes noted.  No proximal intracranial vessel occlusion or significant stenosis is seen.  No large vessel occlusion or hemodynamically significant stenosis in the neck reported.  Neurology were consulted and recommended admission for completion of stroke work-up with echocardiogram.  The hospitalist service was consulted to admit for further evaluation and management.  Review of Systems: All systems reviewed and are negative except as documented in history of present illness above.   Past Medical History:  Diagnosis Date   Anxiety    Asthma    Depression    Diabetes mellitus without complication ([place])    High cholesterol    Hypertension    Kidney disease    Pneumonia     Past Surgical History:  Procedure Laterality Date   COLONOSCOPY WITH PROPOFOL N/A 01/09/2021   Procedure: COLONOSCOPY WITH PROPOFOL;  Surgeon: Rogene Houston, MD;  Location: [facility];  Service: Endoscopy;  Laterality: N/A;  Am   NASAL RECONSTRUCTION     POLYPECTOMY  01/09/2021   Procedure: POLYPECTOMY;  Surgeon: Rogene Houston, MD;  Location: [facility];  Service: Endoscopy;;   TONSILLECTOMY     VASECTOMY     VASECTOMY REVERSAL      Social History:  reports that he has never smoked. He has never used smokeless tobacco. He reports previous alcohol use. He reports that he does not use  drugs.  No Known Allergies  Family History  Problem Relation Age of Onset   Healthy Mother    COPD Father      Prior to Admission medications   Medication Sig Start Date End Date Taking? Authorizing Provider  albuterol (VENTOLIN HFA) 108 (90 Base) MCG/ACT inhaler Inhale 2 puffs into the lungs every 6 (six) hours as needed for shortness of breath. 09/10/20   [provider]   Apple Cid Vn-Grn Tea-Bit Or-Cr (APPLE CIDER VINEGAR PLUS PO) Take 10 mLs by mouth daily as needed (Indigestion). Mix with water    [provider]  Ascorbic Acid (VITAMIN C PO) Take 200 mg by mouth daily.    [provider]  calcipotriene (DOVONOX) 0.005 % cream Apply 1 application topically daily.    [provider]  cetirizine (ZYRTEC) 10 MG tablet Take 10 mg by mouth daily.    [provider]  Coenzyme Q10 (CO Q 10) 100 MG CAPS Take 100 mg by mouth at bedtime.    [provider]  Cyanocobalamin (VITAMIN B 12) 100 MCG LOZG Take 1,000 mg by mouth daily.    [provider]  FLUoxetine (PROZAC) 40 MG capsule Take 40 mg by mouth daily.    [provider]  Garlic 4656 MG CAPS Take 2,000 mg by mouth daily.    [provider]  ibuprofen (ADVIL) 200 MG tablet Take 200-400 mg by mouth every 6 (six) hours as needed for fever or moderate pain.    [provider]  ipratropium (ATROVENT) 0.03 % nasal spray Place 1 spray into both nostrils daily as needed for congestion. 09/10/20   [provider]  MAGNESIUM CITRATE PO Take 100 mg by mouth in the morning and at bedtime.    [provider]  Milk Thistle 1000 MG CAPS Take 1,000 mg by mouth daily.    [provider]  Misc Natural Products (PROSTATE SUPPORT PO) Take 1,000 mg by mouth in the morning and at bedtime.    [provider]  Multiple Vitamin (MULTIVITAMIN) capsule Take 1 capsule by mouth at bedtime.    [provider]  Omega-3 Fatty Acids (FISH OIL PO) Take 2,400 mg by mouth in the morning and at bedtime.    [provider]  polyethylene glycol-electrolytes (TRILYTE) 420 g solution Take 4,000 mLs by mouth as directed. 12/10/20   Rogene Houston, MD  Potassium 99 MG TABS Take 99 mg by mouth at bedtime.    [provider]  simvastatin (ZOCOR) 40 MG tablet Take 20 mg by mouth at bedtime.    [provider]   Skin Protectants, Misc. (EUCERIN) cream Apply 1 application topically at bedtime.    [provider]    Physical Exam: Vitals:   03/08/21 1400 03/08/21 1415 03/08/21 1722 03/08/21 1815  BP: (!) 175/83 (!) 155/84 (!) 147/94 (!) 180/88  Pulse: 63 (!) 58 (!) 59 61  Resp: 18 15 13 19   Temp:      TempSrc:      SpO2: 99% 98% 97% 99%  Weight:      Height:       Constitutional: NAD, calm, comfortable Eyes: PERRL, lids and conjunctivae normal ENMT: Mucous membranes are moist. Posterior pharynx clear of any exudate or lesions.Normal dentition.  Neck: normal, supple, no masses. Respiratory: clear to auscultation bilaterally, no wheezing, no crackles. Normal respiratory effort. No accessory muscle use.  Cardiovascular: Regular rate and rhythm, no murmurs / rubs / gallops. No extremity  edema. 2+ pedal pulses. Abdomen: no tenderness, no masses palpated. No hepatosplenomegaly. Bowel sounds positive.  Musculoskeletal: no clubbing / cyanosis. No joint deformity upper and lower extremities. Good ROM, no contractures. Normal muscle tone.  Skin: no rashes, lesions, ulcers. No induration Neurologic: CN 2-12 grossly intact. Sensation intact. Strength 5/5 in all 4.  Psychiatric: Normal judgment and insight. Alert and oriented x 3. Normal mood.   Labs on Admission: I have personally reviewed following labs and imaging studies  CBC: Recent Labs  Lab 03/08/21 1050 03/08/21 1106  WBC 9.1  --   NEUTROABS 6.4  --   HGB 14.4 14.6  HCT 43.3 43.0  MCV 93.9  --   PLT 250  --    Basic Metabolic Panel: Recent Labs  Lab 03/08/21 1050 03/08/21 1106  NA 138 141  K 4.3 4.1  CL 103 104  CO2 25  --   GLUCOSE 129* 127*  BUN 21 23  CREATININE 1.14 1.10  CALCIUM 9.7  --    GFR: Estimated Creatinine Clearance: 66.6 mL/min (by C-G formula based on SCr of 1.1 mg/dL). Liver Function Tests: Recent Labs  Lab 03/08/21 1050  AST 31  ALT 21  ALKPHOS 48  BILITOT 0.7  PROT 7.1  ALBUMIN 4.4    No results for input(s): LIPASE, AMYLASE in the last 168 hours. No results for input(s): AMMONIA in the last 168 hours. Coagulation Profile: Recent Labs  Lab 03/08/21 1050  INR 1.0   Cardiac Enzymes: No results for input(s): CKTOTAL, CKMB, CKMBINDEX, TROPONINI in the last 168 hours. BNP (last 3 results) No results for input(s): PROBNP in the last 8760 hours. HbA1C: No results for input(s): HGBA1C in the last 72 hours. CBG: Recent Labs  Lab 03/08/21 1046  GLUCAP 119*   Lipid Profile: No results for input(s): CHOL, HDL, LDLCALC, TRIG, CHOLHDL, LDLDIRECT in the last 72 hours. Thyroid Function Tests: No results for input(s): TSH, T4TOTAL, FREET4, T3FREE, THYROIDAB in the last 72 hours. Anemia Panel: No results for input(s): VITAMINB12, FOLATE, FERRITIN, TIBC, IRON, RETICCTPCT in the last 72 hours. Urine analysis:    Component Value Date/Time   COLORURINE YELLOW 07/28/2020 1247   APPEARANCEUR HAZY (A) 07/28/2020 1247   LABSPEC 1.024 07/28/2020 1247   PHURINE 5.0 07/28/2020 1247   GLUCOSEU NEGATIVE 07/28/2020 1247   HGBUR NEGATIVE 07/28/2020 [place] 07/28/2020 [place] 07/28/2020 1247   PROTEINUR 30 (A) 07/28/2020 1247   NITRITE NEGATIVE 07/28/2020 1247   LEUKOCYTESUR NEGATIVE 07/28/2020 1247    Radiological Exams on Admission: MR ANGIO HEAD WO CONTRAST  Result Date: 03/08/2021 CLINICAL DATA:  Multiple episodes of left vision loss EXAM: MRI HEAD WITHOUT CONTRAST MRA HEAD WITHOUT CONTRAST MRA NECK WITHOUT AND WITH CONTRAST TECHNIQUE: Multiplanar, multi-echo pulse sequences of the brain and surrounding structures were acquired without intravenous contrast. Angiographic images of the Circle of Willis were acquired using MRA technique without intravenous contrast. Angiographic images of the neck were acquired using MRA technique without and with intravenous contrast. Carotid stenosis measurements (when applicable) are obtained utilizing  NASCET criteria, using the distal internal carotid diameter as the denominator. CONTRAST:  8.62mL GADAVIST GADOBUTROL 1 MMOL/ML IV SOLN COMPARISON:  None. FINDINGS: MRI HEAD Brain: There is no acute infarction or intracranial hemorrhage. There is no intracranial mass, mass effect, or edema. There is no hydrocephalus or extra-axial fluid collection. Prominence of the ventricles and sulci reflects mild generalized parenchymal volume loss. Patchy foci of T2 hyperintensity in the supratentorial  white matter nonspecific may reflect mild chronic microvascular ischemic changes. Vascular: Major vessel flow voids at the skull base are preserved. Skull and upper cervical spine: Normal marrow signal is preserved. Sinuses/Orbits: Minor mucosal thickening. Bilateral lens replacements. Other: Sella is partially empty.  Mastoid air cells are clear. MRA HEAD Intracranial internal carotid arteries are patent. Middle and anterior cerebral arteries are patent. Intracranial vertebral arteries, basilar artery, posterior cerebral arteries are patent. Right posterior communicating artery is present with fetal origin of the right PCA. There is no significant stenosis or aneurysm. MRA NECK Great vessel origins are patent. There is no proximal subclavian artery stenosis. Common, internal, and external carotid arteries are patent. There is no stenosis or evidence of dissection. Codominant vertebral arteries are patent. No stenosis or evidence of dissection. IMPRESSION: No acute infarction, hemorrhage, or mass. Mild chronic microvascular ischemic changes. No proximal intracranial vessel occlusion or significant stenosis. No large vessel occlusion or hemodynamically significant stenosis in the neck. Electronically Signed   By: Macy Mis M.D.   On: 03/08/2021 15:55   MR Angiogram Neck W or Wo Contrast  Result Date: 03/08/2021 CLINICAL DATA:  Multiple episodes of left vision loss EXAM: MRI HEAD WITHOUT CONTRAST MRA HEAD WITHOUT CONTRAST  MRA NECK WITHOUT AND WITH CONTRAST TECHNIQUE: Multiplanar, multi-echo pulse sequences of the brain and surrounding structures were acquired without intravenous contrast. Angiographic images of the Circle of Willis were acquired using MRA technique without intravenous contrast. Angiographic images of the neck were acquired using MRA technique without and with intravenous contrast. Carotid stenosis measurements (when applicable) are obtained utilizing NASCET criteria, using the distal internal carotid diameter as the denominator. CONTRAST:  8.68mL GADAVIST GADOBUTROL 1 MMOL/ML IV SOLN COMPARISON:  None. FINDINGS: MRI HEAD Brain: There is no acute infarction or intracranial hemorrhage. There is no intracranial mass, mass effect, or edema. There is no hydrocephalus or extra-axial fluid collection. Prominence of the ventricles and sulci reflects mild generalized parenchymal volume loss. Patchy foci of T2 hyperintensity in the supratentorial white matter nonspecific may reflect mild chronic microvascular ischemic changes. Vascular: Major vessel flow voids at the skull base are preserved. Skull and upper cervical spine: Normal marrow signal is preserved. Sinuses/Orbits: Minor mucosal thickening. Bilateral lens replacements. Other: Sella is partially empty.  Mastoid air cells are clear. MRA HEAD Intracranial internal carotid arteries are patent. Middle and anterior cerebral arteries are patent. Intracranial vertebral arteries, basilar artery, posterior cerebral arteries are patent. Right posterior communicating artery is present with fetal origin of the right PCA. There is no significant stenosis or aneurysm. MRA NECK Great vessel origins are patent. There is no proximal subclavian artery stenosis. Common, internal, and external carotid arteries are patent. There is no stenosis or evidence of dissection. Codominant vertebral arteries are patent. No stenosis or evidence of dissection. IMPRESSION: No acute infarction,  hemorrhage, or mass. Mild chronic microvascular ischemic changes. No proximal intracranial vessel occlusion or significant stenosis. No large vessel occlusion or hemodynamically significant stenosis in the neck. Electronically Signed   By: Macy Mis M.D.   On: 03/08/2021 15:55   MR BRAIN WO CONTRAST  Result Date: 03/08/2021 CLINICAL DATA:  Multiple episodes of left vision loss EXAM: MRI HEAD WITHOUT CONTRAST MRA HEAD WITHOUT CONTRAST MRA NECK WITHOUT AND WITH CONTRAST TECHNIQUE: Multiplanar, multi-echo pulse sequences of the brain and surrounding structures were acquired without intravenous contrast. Angiographic images of the Circle of Willis were acquired using MRA technique without intravenous contrast. Angiographic images of the neck were acquired using MRA technique  without and with intravenous contrast. Carotid stenosis measurements (when applicable) are obtained utilizing NASCET criteria, using the distal internal carotid diameter as the denominator. CONTRAST:  8.78mL GADAVIST GADOBUTROL 1 MMOL/ML IV SOLN COMPARISON:  None. FINDINGS: MRI HEAD Brain: There is no acute infarction or intracranial hemorrhage. There is no intracranial mass, mass effect, or edema. There is no hydrocephalus or extra-axial fluid collection. Prominence of the ventricles and sulci reflects mild generalized parenchymal volume loss. Patchy foci of T2 hyperintensity in the supratentorial white matter nonspecific may reflect mild chronic microvascular ischemic changes. Vascular: Major vessel flow voids at the skull base are preserved. Skull and upper cervical spine: Normal marrow signal is preserved. Sinuses/Orbits: Minor mucosal thickening. Bilateral lens replacements. Other: Sella is partially empty.  Mastoid air cells are clear. MRA HEAD Intracranial internal carotid arteries are patent. Middle and anterior cerebral arteries are patent. Intracranial vertebral arteries, basilar artery, posterior cerebral arteries are patent.  Right posterior communicating artery is present with fetal origin of the right PCA. There is no significant stenosis or aneurysm. MRA NECK Great vessel origins are patent. There is no proximal subclavian artery stenosis. Common, internal, and external carotid arteries are patent. There is no stenosis or evidence of dissection. Codominant vertebral arteries are patent. No stenosis or evidence of dissection. IMPRESSION: No acute infarction, hemorrhage, or mass. Mild chronic microvascular ischemic changes. No proximal intracranial vessel occlusion or significant stenosis. No large vessel occlusion or hemodynamically significant stenosis in the neck. Electronically Signed   By: Macy Mis M.D.   On: 03/08/2021 15:55    EKG: Ordered and pending.  Assessment/Plan Principal Problem:   Amaurosis fugax of left eye Active Problems:   DM (diabetes mellitus) ([place])   Hyperlipidemia   Depression with anxiety   Hypertension   ELIJHA DEDMAN is a 75 y.o. male with medical history significant for hyperlipidemia, depression/anxiety who is admitted with amaurosis fugax of left eye for TIA work-up.  Amaurosis fugax of left eye/TIA: Transient left visual field loss on 6/9.  MRI brain without evidence of acute infarct.  No revealing etiology on MRA head or neck.  Neurology recommending admission for further evaluation and management. -Obtain echocardiogram -Started on Plavix 75 mg daily per neurology -Patient agreeable to switch simvastatin to atorvastatin 40 mg daily -Monitor on telemetry, continue neurochecks -PT/OT/SLP eval  Elevated blood pressure: Initially hypertensive, BP has now normalized.  He is not on antihypertensives as an outpatient, had if needed.  Hyperlipidemia: Switch simvastatin to atorvastatin 40 mg daily as above.  Type 2 diabetes: Patient reports his A1c was 6.6% by his PCP last month.  He is working on diet and lifestyle modification.  Depression/anxiety: Continue Prozac.  DVT  prophylaxis: Lovenox Code Status: Full code, confirmed with patient Family Communication: Discussed with patient, he has discussed with family Disposition Plan: From home and likely discharge to home pending further TIA work-up Consults called: Neurology Level of care: Telemetry Medical Admission status:  Status is: Observation  The patient remains OBS appropriate and will d/c before 2 midnights.  Dispo: The patient is from: Home              Anticipated d/c is to: Home              Patient currently is not medically stable to d/c.   Difficult to place patient No   Zada Finders MD [facility]  If 7PM-7AM, please contact night-coverage [email or web address]  03/08/2021, 8:03 PM

## 2021-03-08 NOTE — Discharge Instructions (Signed)
Like we discussed, please continue to monitor your symptoms closely.  If you develop any new or worsening symptoms I would highly recommend you come back to the emergency department for immediate reevaluation.  I put a new referral to a local neurologist.  Their contact information is below.  They should be reaching out to you to schedule an appointment within the next week but I would also recommend give them a call and following up on this appointment.  Again, if you develop any new or worsening symptoms please come back to the emergency department.  It was a pleasure to meet you.

## 2021-03-08 NOTE — ED Notes (Signed)
Pt transferred to MRI

## 2021-03-08 NOTE — ED Provider Notes (Signed)
There is [facility] DEPARTMENT Provider Note   CSN: [id number] Arrival date & time: 03/08/21  1029     History Chief Complaint  Patient presents with   Loss of Vision    Douglas Erickson is a 75 y.o. male.  HPI Per records from patient's optometrist that were sent to the emergency department, his exam earlier today is concerning for amaurosis fugax.  Yesterday patient noted that he bent over while working and his left eye went completely dark.  He noted that he stood back up and eventually cleared.  He went outside to mow the yard and it began again.  Describes it as an episode of grayness that started on the right side and spread to the left.  It occurred 2 more times yesterday evening.  The last episode he saw sparks or flashes of light occurring.  He denied any headaches or nausea.  No history of migraines.  He was given 325 mg of aspirin and sent to the emergency department for TIA work-up.  Patient confirms the above history.  He states that his episodes last no more than 1 to 2 minutes at a time.  He further notes that the last episode yesterday where he saw spots of light he describes them as similar to fireworks.  His symptoms all resolved overnight.  Denies any symptoms today.  No visual changes or eye pain.  Denies any chest pain, shortness of breath, numbness, tingling, or weakness.  States "he feels fine".   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   History of ehrlichiosis 05/39/7673   Leukocytosis 08/06/2020   Back pain 08/06/2020   Transaminitis 08/06/2020    Community acquired pneumonia of left lower lobe of lung    DM (diabetes mellitus) ([place]) 07/28/2020   Benign essential HTN 07/28/2020   Hyperlipidemia 07/28/2020   Depression 07/28/2020   Fever 07/28/2020   Hypertension    Diabetes mellitus without complication ([place])    Kidney disease     Past Surgical History:  Procedure Laterality Date   COLONOSCOPY WITH PROPOFOL N/A 01/09/2021   Procedure: COLONOSCOPY WITH PROPOFOL;  Surgeon: Rogene Houston, MD;  Location: [facility];  Service: Endoscopy;  Laterality: N/A;  Am   NASAL RECONSTRUCTION     POLYPECTOMY  01/09/2021   Procedure: POLYPECTOMY;  Surgeon: Rogene Houston, MD;  Location: [facility];  Service: Endoscopy;;   TONSILLECTOMY     VASECTOMY     VASECTOMY REVERSAL         Family History  Problem Relation Age of Onset   Healthy Mother    COPD Father     Social History   Tobacco Use   Smoking status: Never   Smokeless tobacco: Never  Vaping Use   Vaping Use: Never used  Substance Use Topics   Alcohol use: Not Currently   Drug use: Never    Home Medications Prior to Admission medications   Medication Sig Start Date End Date Taking? Authorizing Provider  albuterol (VENTOLIN HFA)  108 (90 Base) MCG/ACT inhaler Inhale 2 puffs into the lungs every 6 (six) hours as needed for shortness of breath. 09/10/20   [provider]  Apple Cid Vn-Grn Tea-Bit Or-Cr (APPLE CIDER VINEGAR PLUS PO) Take 10 mLs by mouth daily as needed (Indigestion). Mix with water    [provider]  Ascorbic Acid (VITAMIN C PO) Take 200 mg by mouth daily.    [provider]  calcipotriene (DOVONOX) 0.005 % cream Apply 1 application topically daily.    [provider]  cetirizine (ZYRTEC) 10 MG tablet Take 10 mg by mouth daily.    [provider]  Coenzyme Q10 (CO Q 10) 100 MG CAPS Take 100 mg by mouth at bedtime.    [provider]  Cyanocobalamin (VITAMIN B 12) 100 MCG LOZG Take 1,000 mg by  mouth daily.    [provider]  FLUoxetine (PROZAC) 40 MG capsule Take 40 mg by mouth daily.    [provider]  Garlic 3893 MG CAPS Take 2,000 mg by mouth daily.    [provider]  ibuprofen (ADVIL) 200 MG tablet Take 200-400 mg by mouth every 6 (six) hours as needed for fever or moderate pain.    [provider]  ipratropium (ATROVENT) 0.03 % nasal spray Place 1 spray into both nostrils daily as needed for congestion. 09/10/20   [provider]  MAGNESIUM CITRATE PO Take 100 mg by mouth in the morning and at bedtime.    [provider]  Milk Thistle 1000 MG CAPS Take 1,000 mg by mouth daily.    [provider]  Misc Natural Products (PROSTATE SUPPORT PO) Take 1,000 mg by mouth in the morning and at bedtime.    [provider]  Multiple Vitamin (MULTIVITAMIN) capsule Take 1 capsule by mouth at bedtime.    [provider]  Omega-3 Fatty Acids (FISH OIL PO) Take 2,400 mg by mouth in the morning and at bedtime.    [provider]  polyethylene glycol-electrolytes (TRILYTE) 420 g solution Take 4,000 mLs by mouth as directed. 12/10/20   Rogene Houston, MD  Potassium 99 MG TABS Take 99 mg by mouth at bedtime.    [provider]  simvastatin (ZOCOR) 40 MG tablet Take 20 mg by mouth at bedtime.    [provider]  Skin Protectants, Misc. (EUCERIN) cream Apply 1 application topically at bedtime.    [provider]    Allergies    Patient has no known allergies.  Review of Systems   Review of Systems  All other systems reviewed and are negative. Ten systems reviewed and are negative for acute change, except as noted in the HPI.   Physical Exam Updated Vital Signs BP (!) 147/94   Pulse (!) 59   Temp 98.2 F (36.8 C) (Oral)   Resp 13   Ht 6' (1.829 m)   Wt 81.6 kg   SpO2 97%   BMI 24.40 kg/m   Physical Exam Vitals and nursing note reviewed.  Constitutional:      General:  He is not in acute distress.    Appearance: Normal appearance. He is not ill-appearing, toxic-appearing or diaphoretic.  HENT:     Head: Normocephalic and atraumatic.     Right Ear: External ear normal.     Left Ear: External ear normal.     Nose: Nose normal.     Mouth/Throat:     Mouth: Mucous membranes are moist.  Pharynx: Oropharynx is clear. No oropharyngeal exudate or posterior oropharyngeal erythema.  Eyes:     General: No scleral icterus.       Right eye: No discharge.        Left eye: No discharge.     Extraocular Movements: Extraocular movements intact.     Conjunctiva/sclera: Conjunctivae normal.     Comments: Left eye dilated by optometrist prior to arrival.  Mildly reactive to light.  Extraocular movements are intact.  Visual fields intact.  Sclera clear.  Right pupil is round and reactive to light.  Extraocular movements are intact.  Visual fields intact.  Sclera clear.  Cardiovascular:     Rate and Rhythm: Normal rate and regular rhythm.     Pulses: Normal pulses.     Heart sounds: Normal heart sounds. No murmur heard.   No friction rub. No gallop.  Pulmonary:     Effort: Pulmonary effort is normal. No respiratory distress.     Breath sounds: Normal breath sounds. No stridor. No wheezing, rhonchi or rales.  Abdominal:     General: Abdomen is flat.     Palpations: Abdomen is soft.     Tenderness: There is no abdominal tenderness.  Musculoskeletal:        General: Normal range of motion.     Cervical back: Normal range of motion and neck supple. No tenderness.  Skin:    General: Skin is warm and dry.  Neurological:     General: No focal deficit present.     Mental Status: He is alert and oriented to person, place, and time.     Comments: Patient is oriented to person, place, and time. Patient phonates in clear, complete, and coherent sentences. Negative arm drift. Finger to nose intact bilaterally with no visible signs of dysmetria. Strength is 5/5 in all four  extremities.  Patient able to raise legs off bed and hold for 5 seconds on each side.  Distal sensation intact in all four extremities.  Psychiatric:        Mood and Affect: Mood normal.        Behavior: Behavior normal.   ED Results / Procedures / Treatments   Labs (all labs ordered are listed, but only abnormal results are displayed) Labs Reviewed  COMPREHENSIVE METABOLIC PANEL - Abnormal; Notable for the following components:      Result Value   Glucose, Bld 129 (*)    All other components within normal limits  I-STAT CHEM 8, ED - Abnormal; Notable for the following components:   Glucose, Bld 127 (*)    All other components within normal limits  CBG MONITORING, ED - Abnormal; Notable for the following components:   Glucose-Capillary 119 (*)    All other components within normal limits  RESP PANEL BY RT-PCR (FLU A&B, COVID) ARPGX2  PROTIME-INR  APTT  CBC  DIFFERENTIAL   EKG None  Radiology MR ANGIO HEAD WO CONTRAST  Result Date: 03/08/2021 CLINICAL DATA:  Multiple episodes of left vision loss EXAM: MRI HEAD WITHOUT CONTRAST MRA HEAD WITHOUT CONTRAST MRA NECK WITHOUT AND WITH CONTRAST TECHNIQUE: Multiplanar, multi-echo pulse sequences of the brain and surrounding structures were acquired without intravenous contrast. Angiographic images of the Circle of Willis were acquired using MRA technique without intravenous contrast. Angiographic images of the neck were acquired using MRA technique without and with intravenous contrast. Carotid stenosis measurements (when applicable) are obtained utilizing NASCET criteria, using the distal internal carotid diameter as the denominator. CONTRAST:  8.76mL GADAVIST GADOBUTROL  1 MMOL/ML IV SOLN COMPARISON:  None. FINDINGS: MRI HEAD Brain: There is no acute infarction or intracranial hemorrhage. There is no intracranial mass, mass effect, or edema. There is no hydrocephalus or extra-axial fluid collection. Prominence of the ventricles and sulci  reflects mild generalized parenchymal volume loss. Patchy foci of T2 hyperintensity in the supratentorial white matter nonspecific may reflect mild chronic microvascular ischemic changes. Vascular: Major vessel flow voids at the skull base are preserved. Skull and upper cervical spine: Normal marrow signal is preserved. Sinuses/Orbits: Minor mucosal thickening. Bilateral lens replacements. Other: Sella is partially empty.  Mastoid air cells are clear. MRA HEAD Intracranial internal carotid arteries are patent. Middle and anterior cerebral arteries are patent. Intracranial vertebral arteries, basilar artery, posterior cerebral arteries are patent. Right posterior communicating artery is present with fetal origin of the right PCA. There is no significant stenosis or aneurysm. MRA NECK Great vessel origins are patent. There is no proximal subclavian artery stenosis. Common, internal, and external carotid arteries are patent. There is no stenosis or evidence of dissection. Codominant vertebral arteries are patent. No stenosis or evidence of dissection. IMPRESSION: No acute infarction, hemorrhage, or mass. Mild chronic microvascular ischemic changes. No proximal intracranial vessel occlusion or significant stenosis. No large vessel occlusion or hemodynamically significant stenosis in the neck. Electronically Signed   By: Macy Mis M.D.   On: 03/08/2021 15:55   MR Angiogram Neck W or Wo Contrast  Result Date: 03/08/2021 CLINICAL DATA:  Multiple episodes of left vision loss EXAM: MRI HEAD WITHOUT CONTRAST MRA HEAD WITHOUT CONTRAST MRA NECK WITHOUT AND WITH CONTRAST TECHNIQUE: Multiplanar, multi-echo pulse sequences of the brain and surrounding structures were acquired without intravenous contrast. Angiographic images of the Circle of Willis were acquired using MRA technique without intravenous contrast. Angiographic images of the neck were acquired using MRA technique without and with intravenous contrast. Carotid  stenosis measurements (when applicable) are obtained utilizing NASCET criteria, using the distal internal carotid diameter as the denominator. CONTRAST:  8.2mL GADAVIST GADOBUTROL 1 MMOL/ML IV SOLN COMPARISON:  None. FINDINGS: MRI HEAD Brain: There is no acute infarction or intracranial hemorrhage. There is no intracranial mass, mass effect, or edema. There is no hydrocephalus or extra-axial fluid collection. Prominence of the ventricles and sulci reflects mild generalized parenchymal volume loss. Patchy foci of T2 hyperintensity in the supratentorial white matter nonspecific may reflect mild chronic microvascular ischemic changes. Vascular: Major vessel flow voids at the skull base are preserved. Skull and upper cervical spine: Normal marrow signal is preserved. Sinuses/Orbits: Minor mucosal thickening. Bilateral lens replacements. Other: Sella is partially empty.  Mastoid air cells are clear. MRA HEAD Intracranial internal carotid arteries are patent. Middle and anterior cerebral arteries are patent. Intracranial vertebral arteries, basilar artery, posterior cerebral arteries are patent. Right posterior communicating artery is present with fetal origin of the right PCA. There is no significant stenosis or aneurysm. MRA NECK Great vessel origins are patent. There is no proximal subclavian artery stenosis. Common, internal, and external carotid arteries are patent. There is no stenosis or evidence of dissection. Codominant vertebral arteries are patent. No stenosis or evidence of dissection. IMPRESSION: No acute infarction, hemorrhage, or mass. Mild chronic microvascular ischemic changes. No proximal intracranial vessel occlusion or significant stenosis. No large vessel occlusion or hemodynamically significant stenosis in the neck. Electronically Signed   By: Macy Mis M.D.   On: 03/08/2021 15:55   MR BRAIN WO CONTRAST  Result Date: 03/08/2021 CLINICAL DATA:  Multiple episodes of left vision loss  EXAM: MRI  HEAD WITHOUT CONTRAST MRA HEAD WITHOUT CONTRAST MRA NECK WITHOUT AND WITH CONTRAST TECHNIQUE: Multiplanar, multi-echo pulse sequences of the brain and surrounding structures were acquired without intravenous contrast. Angiographic images of the Circle of Willis were acquired using MRA technique without intravenous contrast. Angiographic images of the neck were acquired using MRA technique without and with intravenous contrast. Carotid stenosis measurements (when applicable) are obtained utilizing NASCET criteria, using the distal internal carotid diameter as the denominator. CONTRAST:  8.29mL GADAVIST GADOBUTROL 1 MMOL/ML IV SOLN COMPARISON:  None. FINDINGS: MRI HEAD Brain: There is no acute infarction or intracranial hemorrhage. There is no intracranial mass, mass effect, or edema. There is no hydrocephalus or extra-axial fluid collection. Prominence of the ventricles and sulci reflects mild generalized parenchymal volume loss. Patchy foci of T2 hyperintensity in the supratentorial white matter nonspecific may reflect mild chronic microvascular ischemic changes. Vascular: Major vessel flow voids at the skull base are preserved. Skull and upper cervical spine: Normal marrow signal is preserved. Sinuses/Orbits: Minor mucosal thickening. Bilateral lens replacements. Other: Sella is partially empty.  Mastoid air cells are clear. MRA HEAD Intracranial internal carotid arteries are patent. Middle and anterior cerebral arteries are patent. Intracranial vertebral arteries, basilar artery, posterior cerebral arteries are patent. Right posterior communicating artery is present with fetal origin of the right PCA. There is no significant stenosis or aneurysm. MRA NECK Great vessel origins are patent. There is no proximal subclavian artery stenosis. Common, internal, and external carotid arteries are patent. There is no stenosis or evidence of dissection. Codominant vertebral arteries are patent. No stenosis or evidence of  dissection. IMPRESSION: No acute infarction, hemorrhage, or mass. Mild chronic microvascular ischemic changes. No proximal intracranial vessel occlusion or significant stenosis. No large vessel occlusion or hemodynamically significant stenosis in the neck. Electronically Signed   By: Macy Mis M.D.   On: 03/08/2021 15:55    Procedures Procedures   Medications Ordered in ED Medications  gadobutrol (GADAVIST) 1 MMOL/ML injection 8.1 mL (8.1 mLs Intravenous Contrast Given 03/08/21 1536)    ED Course  I have reviewed the triage vital signs and the nursing notes.  Pertinent labs & imaging results that were available during my care of the patient were reviewed by me and considered in my medical decision making (see chart for details).  Clinical Course as of 03/08/21 1819  Fri Mar 08, 2021  1344 Patient discussed with neurology.  Recommend MRI of the brain without contrast.  Also recommend MRA of the head and neck.  They recommend admission if patient is amenable.  If not, they recommend ambulatory referral to neurology outpatient. [LJ]  0981 Discussed with neurology.  Patient is now amenable to admission.  They recommend admission for echocardiogram as well as further evaluation and recommendations.  We will discuss with the patient. [LJ]    Clinical Course User Index [LJ] Rayna Sexton, PA-C   MDM Rules/Calculators/A&P                          Pt is a 75 y.o. male who presents to the emergency department due to intermittent visual changes in the left eye yesterday.  He was seen by his optometrist this morning and sent to the emergency department for TIA work-up.  Labs: CBC without abnormalities. Differential without abnormalities. PT/INR within normal limits. APTT within normal limits. CMP with a glucose of 129.  Imaging: MRI of the brain without contrast as well as MRA of the  head and neck shows no acute infarction, hemorrhage, or mass.  They note mild chronic microvascular  ischemic changes.  No proximal intracranial vessel occlusion or significant stenosis.  No large vessel occlusion or hemodynamically significant stenosis in the neck.  I, Rayna Sexton, PA-C, personally reviewed and evaluated these images and lab results as part of my medical decision-making.  Unsure the source of the patient's symptoms.  Possibly a TIA.  He was discussed once again with neurology and they recommend admission for echocardiogram UA she.  Patient initially not amenable to admission but after further discussion he states that he is now comfortable being admitted.  Will discuss with the medicine team.  Note: Portions of this report may have been transcribed using voice recognition software. Every effort was made to ensure accuracy; however, inadvertent computerized transcription errors may be present.   Final Clinical Impression(s) / ED Diagnoses Final diagnoses:  Visual changes   Rx / DC Orders ED Discharge Orders          Ordered    Ambulatory referral to Neurology       Comments: An appointment is requested in approximately: 1 week   03/08/21 1606             Rayna Sexton, PA-C 03/08/21 1820    Fredia Sorrow, MD 03/09/21 [phone]

## 2021-03-08 NOTE — ED Triage Notes (Signed)
Pt sent from [facility] for stroke work up-Pt reports four episodes of one minute loss of vision in left eye yesterday preceded by pressure in eye. Activities when vision disappeared here working with equipment, mowing yard, and bending over. Denies dizziness, but sts his blood pressure was high yesterday. No neuro deficits noted in triage.

## 2021-03-08 NOTE — Consult Note (Signed)
Neurology Consultation  Reason for Consult: amaurosis fugax Referring Physician: Hazle Nordmann, MD  CC: transient loss of vision OS.   History is obtained from: Patient  HPI: Douglas Erickson is a 75 y.o. male with a PMHx of controlled DM not on medications, HTN, HLD, granulocytosis, and depression. Patient relates having 4 episodes yesterday of 1-2 minute vision loss to the OS. The first episode began after he bent over and resolved when he stood back up. 2nd episode began with mowing the lawn and symptoms were the same at 1st episode. 3rd episode described as gray vision that traveled from OD to OS. 4th episode occurred when watching TV and the TV was sparkly. He has not had a r/p episode sin 1900 hrs 03/08/21. He thought that he may have a retinal issue and called his retinal specialist this am who did not think it was his retina. He saw another ophthalmologist and exam was consistent with amaurosis fugax and he was sent to the ED.   He denies dizziness, HA, arm or leg weakness, numbness or tingling, incoordination, aphasia, dysarthria, hx of stroke or seizures. 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  He cannot give details of the outcome of this or whether there is an ongoing contraindication. No history of CAD, stents, carotid stenosis, CEA, or brain surgery.   Patient says he had an episode 5 years ago when he had acute memory loss like forgetting how to use the TV remote. He says TIA may have been mentioned but it was more likely hypoglycemia. There is nothing on our chart or in care everywhere about this.   Last A1c 6.6% in May 2022. Had his cholesterol checked then but can not tell me his LDL result.   Because of transient loss of vision, neurology was asked to consult.   ROS: A robust ROS was performed and is negative except as noted in the HPI.   Past Medical History:  Diagnosis Date   Anxiety    Asthma    Depression    Diabetes mellitus without  complication (HCC)    High cholesterol    Hypertension    Kidney disease    Pneumonia      Family History  Problem Relation Age of Onset   Healthy Mother    COPD Father      Social History:   reports that he has never smoked. He has never used smokeless tobacco. He reports previous alcohol use. He reports that he does not use drugs.  Medications No current facility-administered medications for this encounter.  Current Outpatient Medications:    albuterol (VENTOLIN HFA) 108 (90 Base) MCG/ACT inhaler, Inhale 2 puffs into the lungs every 6 (six) hours as needed for shortness of breath., Disp: , Rfl:    Apple Cid Vn-Grn Tea-Bit Or-Cr (APPLE CIDER VINEGAR PLUS PO), Take 10 mLs by mouth daily as needed (Indigestion). Mix with water, Disp: , Rfl:    Ascorbic Acid (VITAMIN C PO), Take 200 mg by mouth daily., Disp: , Rfl:    calcipotriene (DOVONOX) 0.005 % cream, Apply 1 application topically daily., Disp: , Rfl:    cetirizine (ZYRTEC) 10 MG tablet, Take 10 mg by mouth daily., Disp: , Rfl:    Coenzyme Q10 (CO Q 10) 100 MG CAPS, Take 100 mg by mouth at bedtime., Disp: , Rfl:    Cyanocobalamin (VITAMIN B 12) 100 MCG LOZG, Take 1,000 mg by mouth daily., Disp: , Rfl:  FLUoxetine (PROZAC) 40 MG capsule, Take 40 mg by mouth daily., Disp: , Rfl:    Garlic 9833 MG CAPS, Take 2,000 mg by mouth daily., Disp: , Rfl:    ibuprofen (ADVIL) 200 MG tablet, Take 200-400 mg by mouth every 6 (six) hours as needed for fever or moderate pain., Disp: , Rfl:    ipratropium (ATROVENT) 0.03 % nasal spray, Place 1 spray into both nostrils daily as needed for congestion., Disp: , Rfl:    MAGNESIUM CITRATE PO, Take 100 mg by mouth in the morning and at bedtime., Disp: , Rfl:    Milk Thistle 1000 MG CAPS, Take 1,000 mg by mouth daily., Disp: , Rfl:    Misc Natural Products (PROSTATE SUPPORT PO), Take 1,000 mg by mouth in the morning and at bedtime., Disp: , Rfl:    Multiple Vitamin (MULTIVITAMIN) capsule, Take 1  capsule by mouth at bedtime., Disp: , Rfl:    Omega-3 Fatty Acids (FISH OIL PO), Take 2,400 mg by mouth in the morning and at bedtime., Disp: , Rfl:    polyethylene glycol-electrolytes (TRILYTE) 420 g solution, Take 4,000 mLs by mouth as directed., Disp: 4000 mL, Rfl: 0   Potassium 99 MG TABS, Take 99 mg by mouth at bedtime., Disp: , Rfl:    simvastatin (ZOCOR) 40 MG tablet, Take 20 mg by mouth at bedtime., Disp: , Rfl:    Skin Protectants, Misc. (EUCERIN) cream, Apply 1 application topically at bedtime., Disp: , Rfl:    Exam: Current vital signs: BP (!) 147/94   Pulse (!) 59   Temp 98.2 F (36.8 C) (Oral)   Resp 13   Ht 6' (1.829 m)   Wt 81.6 kg   SpO2 97%   BMI 24.40 kg/m  Vital signs in last 24 hours: Temp:  [98.2 F (36.8 C)] 98.2 F (36.8 C) (06/10 1234) Pulse Rate:  [58-70] 59 (06/10 1722) Resp:  [13-21] 13 (06/10 1722) BP: (128-175)/(78-94) 147/94 (06/10 1722) SpO2:  [97 %-99 %] 97 % (06/10 1722) Weight:  [81.6 kg] 81.6 kg (06/10 1253)  PE: GENERAL: Very well appearing. Awake, alert in NAD. HEENT: - Normocephalic and atraumatic. LUNGS - Normal respiratory effort.  CV - RRR on tele. ABDOMEN - Soft, nontender. Ext: warm, well perfused. Psych: affect light, joking, calm, and cooperative.  NEURO:  Mental Status: AA&Ox3  Speech/Language: speech is without dysarthria or aphasia.  Naming, repetition, fluency, and comprehension intact.  Cranial Nerves:  II: PERRL OD 74mm and reactive. OS 9mm (dilated at eye doctor) and reactive. Visual fields full.  III, IV, VI: EOMI. Lid elevation symmetric and full.  V: sensation is intact and symmetrical to face.  VII: Smile is symmetrical.  VIII:hearing intact to voice IX, X: palate elevation is symmetric. Phonation normal.  XI: normal sternocleidomastoid and trapezius muscle strength ASN:[id number] is symmetrical without fasciculations.   Motor: 5/5 to all muscle groups.  Tone is normal. Bulk is normal.  Sensation- Intact to  light touch bilaterally in all four extremities. Extinction absent to light touch to DSS.  Coordination: FTN intact bilaterally. HKS intact bilaterally. No pronator drift.  Gait- deferred.  NIHSS:  1a Level of Consciousness: 0 1b LOC Questions: 0 1c LOC Commands: 0 2 Best Gaze: 0 3 Visual: 0 4 Facial Palsy: 0 5a Motor Arm - left: 0 5b Motor Arm - Right: 0 6a Motor Leg - Left: 0 6b Motor Leg - Right: 0 7 Limb Ataxia: 0 8 Sensory: 0 9 Best Language: 0 10 Dysarthria: 0  11 Extinction and Inattention: 0 TOTAL: 0   Labs I have reviewed labs in epic and the results pertinent to this consultation are: INR 1.0 .  aPTT 29. Glucose 127.   CBC    Component Value Date/Time   WBC 9.1 03/08/2021 1050   RBC 4.61 03/08/2021 1050   HGB 14.6 03/08/2021 1106   HCT 43.0 03/08/2021 1106   PLT 250 03/08/2021 1050   MCV 93.9 03/08/2021 1050   MCH 31.2 03/08/2021 1050   MCHC 33.3 03/08/2021 1050   RDW 13.9 03/08/2021 1050   LYMPHSABS 1.7 03/08/2021 1050   MONOABS 0.7 03/08/2021 1050   EOSABS 0.3 03/08/2021 1050   BASOSABS 0.0 03/08/2021 1050    CMP     Component Value Date/Time   NA 141 03/08/2021 1106   K 4.1 03/08/2021 1106   CL 104 03/08/2021 1106   CO2 25 03/08/2021 1050   GLUCOSE 127 (H) 03/08/2021 1106   BUN 23 03/08/2021 1106   CREATININE 1.10 03/08/2021 1106   CALCIUM 9.7 03/08/2021 1050   PROT 7.1 03/08/2021 1050   ALBUMIN 4.4 03/08/2021 1050   AST 31 03/08/2021 1050   ALT 21 03/08/2021 1050   ALKPHOS 48 03/08/2021 1050   BILITOT 0.7 03/08/2021 1050   GFRNONAA >60 03/08/2021 1050    Lipid Panel     Component Value Date/Time   TRIG 90 07/28/2020 1349     Imaging MD reviewed the images obtained  MRI brain No acute infarct infarct, no significant abnl  MRA H&N No hemodynamically-significant stenoses  Assessment: 75 yo man presents after recurrent episodes of transient visual disturbance c/f amaurosis fugax / TIA  Recommendations/Plan:   - Admit to  hospitalist service for stroke w/u; stroke team will consult - Goal normotension; avoid hypotension - TTE - Start plavix 75mg  daily based on hx discontinuation of ASA previously by hematologist (unable to find details in chart) -Continue Zocor 40mg  po qd.  -increase statin or change to atorvastatin if LDL over 70.  -no need to check HgA1c-was 6.6 last month.  - Tele - Stroke education - Amb referral to neurology upon discharge w/ amb cardiac monitioring  Stroke team will f/u in AM  Su Monks, MD [facility] [phone]  If 7pm- 7am, please page neurology on call as listed in [place].

## 2021-03-09 ENCOUNTER — Observation Stay (HOSPITAL_BASED_OUTPATIENT_CLINIC_OR_DEPARTMENT_OTHER): Payer: Medicare Other

## 2021-03-09 DIAGNOSIS — G453 Amaurosis fugax: Secondary | ICD-10-CM | POA: Diagnosis not present

## 2021-03-09 DIAGNOSIS — H539 Unspecified visual disturbance: Secondary | ICD-10-CM | POA: Diagnosis not present

## 2021-03-09 DIAGNOSIS — E785 Hyperlipidemia, unspecified: Secondary | ICD-10-CM | POA: Diagnosis not present

## 2021-03-09 DIAGNOSIS — I10 Essential (primary) hypertension: Secondary | ICD-10-CM | POA: Diagnosis not present

## 2021-03-09 DIAGNOSIS — Z20822 Contact with and (suspected) exposure to covid-19: Secondary | ICD-10-CM | POA: Diagnosis not present

## 2021-03-09 DIAGNOSIS — J45909 Unspecified asthma, uncomplicated: Secondary | ICD-10-CM | POA: Diagnosis not present

## 2021-03-09 DIAGNOSIS — E119 Type 2 diabetes mellitus without complications: Secondary | ICD-10-CM | POA: Diagnosis not present

## 2021-03-09 DIAGNOSIS — E1122 Type 2 diabetes mellitus with diabetic chronic kidney disease: Secondary | ICD-10-CM

## 2021-03-09 LAB — ECHOCARDIOGRAM COMPLETE
Area-P 1/2: 2.92 cm2
Height: 72 in
S' Lateral: 3.2 cm
Weight: 2878.33 oz

## 2021-03-09 LAB — LIPID PANEL
Cholesterol: 209 mg/dL — ABNORMAL HIGH (ref 0–200)
HDL: 50 mg/dL (ref >40)
LDL Cholesterol: 132 mg/dL — ABNORMAL HIGH (ref 0–99)
Total CHOL/HDL Ratio: 4.2 RATIO
Triglycerides: 135 mg/dL (ref <150)
VLDL: 27 mg/dL (ref 0–40)

## 2021-03-09 LAB — MAGNESIUM: Magnesium: 2 mg/dL (ref 1.7–2.4)

## 2021-03-09 LAB — POTASSIUM: Potassium: 3.8 mmol/L (ref 3.5–5.1)

## 2021-03-09 MED ORDER — OMEGA-3-ACID ETHYL ESTERS 1 G PO CAPS
1.0000 g | ORAL_CAPSULE | Freq: Two times a day (BID) | ORAL | Status: DC
  Administered 2021-03-09 – 2021-03-10 (×3): 1 g via ORAL
  Filled 2021-03-09 (×3): qty 1

## 2021-03-09 MED ORDER — HYDROCERIN EX CREA
1.0000 "application " | TOPICAL_CREAM | Freq: Every day | CUTANEOUS | Status: DC
  Filled 2021-03-09: qty 113

## 2021-03-09 MED ORDER — VITAMIN B 12 100 MCG PO LOZG: 1000.0000 mg | LOZENGE | Freq: Every day | ORAL | Status: DC

## 2021-03-09 MED ORDER — LORATADINE 10 MG PO TABS
10.0000 mg | ORAL_TABLET | Freq: Every day | ORAL | Status: DC
  Administered 2021-03-09 – 2021-03-10 (×2): 10 mg via ORAL
  Filled 2021-03-09 (×2): qty 1

## 2021-03-09 MED ORDER — VITAMIN B-12 1000 MCG PO TABS
1000.0000 ug | ORAL_TABLET | Freq: Every day | ORAL | Status: DC
  Administered 2021-03-09 – 2021-03-10 (×2): 1000 ug via ORAL
  Filled 2021-03-09 (×2): qty 1

## 2021-03-09 MED ORDER — CO Q 10 100 MG PO CAPS: 100.0000 mg | ORAL_CAPSULE | Freq: Every day | ORAL | Status: DC

## 2021-03-09 NOTE — Therapy (Addendum)
Occupational Therapy Evaluation Patient Details Name: Douglas Erickson MRN: [id number] DOB: 08-05-1946 Today's Date: 03/09/2021    History of Present Illness Pt is 75 y.o. male with medical history significant for hyperlipidemia, depression/anxiety who presented to the ED from his ophthalmology office for TIA/stroke work-up in the setting of amaurosis fugax.  MRI brain without evidence of acute infarct.  No revealing etiology on MRA head or neck NIH 0 per most recent nursing assessment   Clinical Impression   Pt pleasant and agreeable to session, lives at home with wife, is indep with all ADL's and mobility, no falls. Currently presenting to OT without any acute focal deficits, Ox4 and overall excellent safety. After simulated ADL's and in room self care transfers/management, pt presents with no acute or post acute OT needs at time of d/c. OT will sign off.     Follow Up Recommendations  No OT follow up    Equipment Recommendations  None recommended by OT    Recommendations for Other Services       Precautions / Restrictions Precautions Precautions: None Restrictions Weight Bearing Restrictions: No     Mobility Bed Mobility Overal bed mobility: Independent            Transfers Overall transfer level: Independent Equipment used: None             General transfer comment: no evidence of instability, no assist needed    Balance Overall balance assessment: Independent           ADL either performed or assessed with clinical judgement   ADL Overall ADL's : Independent      General ADL Comments: no Ob, no increased difficulty of time required to complete basic ADL's in both sitting and standing at EOB and at sink.     Vision Baseline Vision/History: No visual deficits Patient Visual Report: No change from baseline Vision Assessment?: Yes Eye Alignment: Within Functional Limits     Perception Perception Perception Tested?: Yes   Praxis Praxis Praxis  tested?: Within functional limits    Pertinent Vitals/Pain Pain Assessment: No/denies pain      Hand Dominance Right   Extremity/Trunk Assessment Upper Extremity Assessment Upper Extremity Assessment: Overall WFL for tasks assessed    Lower Extremity Assessment Lower Extremity Assessment: Defer to PT evaluation    Cervical / Trunk Assessment Cervical / Trunk Assessment: Normal    Communication Communication Communication: No difficulties    Cognition Arousal/Alertness: Awake/alert  Behavior During Therapy: WFL for tasks assessed/performed  Overall Cognitive Status: Within Functional Limits for tasks assessed              General Comments  VSS    Exercises     Shoulder Instructions      Home Living Family/patient expects to be discharged to:: Private residence  Living Arrangements: Spouse/significant other  Available Help at Discharge: Family Type of Home: House  Home Access: Stairs to enter;Other (comment) (split level home Entrance Stairs-Number of Steps: 4-10  Entrance Stairs-Rails: Right  Home Layout: Able to live on main level with bedroom/bathroom  Alternate Level Stairs-Number of Steps: split level with 7 steps, plus full set to basement Alternate Level Stairs-Rails: Right;Left Bathroom Shower/Tub: Animal nutritionist: Standard     Home Equipment: None (        Prior Functioning/Environment Level of Independence: Independent      Comments: drives, works as Scientist, research (life sciences) Problem List:  OT Treatment/Interventions:      OT Goals(Current goals can be found in the care plan section) Acute Rehab OT Goals Patient Stated Goal: to leave today OT Goal Formulation: With patient Time For Goal Achievement: 03/09/21 Potential to Achieve Goals: Good  OT Frequency:     Barriers to D/C:            Co-evaluation              AM-PAC OT "6 Clicks" Daily Activity     Outcome Measure Help from another person eating  meals?: None Help from another person taking care of personal grooming?: None Help from another person toileting, which includes using toliet, bedpan, or urinal?: None Help from another person bathing (including washing, rinsing, drying)?: None Help from another person to put on and taking off regular upper body clothing?: None Help from another person to put on and taking off regular lower body clothing?: None 6 Click Score: 24   End of Session Equipment Utilized During Treatment: Other (comment) (none) Nurse Communication: Mobility status  Activity Tolerance: Patient tolerated treatment well Patient left: in bed;with call bell/phone within reach  OT Visit Diagnosis: Low vision, both eyes (H54.2)                Time: 8288-3374 OT Time Calculation (min): 18 min Charges:  OT General Charges $OT Visit: 1 Visit OT Evaluation $OT Eval Low Complexity: 1 Low  Vaneta Hammontree OTR/L acute rehab services Office: [phone]  03/09/2021, 11:01 AM

## 2021-03-09 NOTE — Progress Notes (Signed)
PROGRESS NOTE    Douglas Erickson  NIO:[id number] DOB: 17-Aug-1946 DOA: 03/08/2021 PCP: Monico Blitz, MD (Confirm with patient/family/NH records and if not entered, this HAS to be entered at [facility] point of entry. "No PCP" if truly none.)   Chief Complaint  Patient presents with   Loss of Vision    Brief Narrative:  Patient 75 year old gentleman history of controlled diabetes mellitus, hypertension, hyperlipidemia, granulocytosis, depression presented with 4 episodes of 1 to 2-minute visual loss to the left eye.  Patient noted to have been on aspirin in the past which was subsequently discontinued by his hematologist. -Patient seen by his ophthalmologist and subsequently sent to the ED for further evaluation to rule out acute CVA. Neurology consulted and are following.   Assessment & Plan:   Principal Problem:   Amaurosis fugax of left eye Active Problems:   DM (diabetes mellitus) ([place])   Hyperlipidemia   Depression with anxiety   Hypertension   1 transient visual loss consistent with amaurosis fugax/TIA -Patient noted to have presented with 4 episodes of 1 to 2-minute visual loss to the left eye. -Patient noted to have been on aspirin before in the past which was subsequently discontinued by his hematologist. -Patient seen by ophthalmology in the outpatient setting and sent to the ED for evaluation for CVA. -Clinical improvement. -MRI brain, MRA head and neck done negative for any acute abnormalities or LVO. -2D echo ordered and pending. -Fasting lipid panel with LDL of 132. -Goal LDL < 70. -Patient's Zocor discontinued and patient started on atorvastatin grams daily. -PT/OT/SLP. -Patient seen in consultation by neurology and patient started on Plavix 75 mg daily based on history of discontinuation of aspirin previously by hematology. -Stroke team following.  2.  Elevated blood pressure -Patient noted initially to be hypertensive however BP normalized. -Not on antihypertensive  medications in the outpatient setting. -Follow.  3.  Depression with anxiety -Resume home regimen Prozac.  4.  Hyperlipidemia -LDL 132. -Zocor changed to atorvastatin. -Resume home regimen Lovaza.  5.  Diet-controlled type 2 diabetes -Patient told admitting physician A1c was 6.6 by PCP last month. -Patient noted to be on diet and lifestyle modification.   DVT prophylaxis: Lovenox Code Status: Full Family Communication: Updated patient.  No family at bedside Disposition:   Status is: Observation  The patient remains OBS appropriate and will d/c before 2 midnights.  Dispo: The patient is from: Home              Anticipated d/c is to: Home              Patient currently is not medically stable to d/c.   Difficult to place patient No       Consultants:  Neurology: Dr. Quinn Axe 03/08/2021  Procedures:  MRI brain 03/08/2021 MR angiogram head and neck 03/08/2021 2D echo pending 03/09/2021  Antimicrobials:  None   Subjective: Laying in bed.  Denies any further visual loss.  No asymmetric weakness or numbness.  No chest pain.  No shortness of breath.  Feels close to baseline.  Hoping to be discharged today.  Objective: Vitals:   03/09/21 0430 03/09/21 0630 03/09/21 0758 03/09/21 1215  BP: 133/74 136/85 117/72 133/79  Pulse: (!) 58 (!) 57 63 66  Resp: 17 18 16 16   Temp: 97.7 F (36.5 C) 97.8 F (36.6 C) 97.9 F (36.6 C) 98.6 F (37 C)  TempSrc: Oral Oral Oral Oral  SpO2: 94% 96% 96% 96%  Weight:  Height:        Intake/Output Summary (Last 24 hours) at 03/09/2021 1407 Last data filed at 03/08/2021 2230 Gross per 24 hour  Intake 240 ml  Output --  Net 240 ml   Filed Weights   03/08/21 1253  Weight: 81.6 kg    Examination:  General exam: Appears calm and comfortable  Respiratory system: Clear to auscultation. Respiratory effort normal. Cardiovascular system: S1 & S2 heard, RRR. No JVD, murmurs, rubs, gallops or clicks. No pedal edema. Gastrointestinal  system: Abdomen is nondistended, soft and nontender. No organomegaly or masses felt. Normal bowel sounds heard. Central nervous system: Alert and oriented. No focal neurological deficits. Extremities: Symmetric 5 x 5 power. Skin: No rashes, lesions or ulcers Psychiatry: Judgement and insight appear normal. Mood & affect appropriate.     Data Reviewed: I have personally reviewed following labs and imaging studies  CBC: Recent Labs  Lab 03/08/21 1050 03/08/21 1106  WBC 9.1  --   NEUTROABS 6.4  --   HGB 14.4 14.6  HCT 43.3 43.0  MCV 93.9  --   PLT 250  --     Basic Metabolic Panel: Recent Labs  Lab 03/08/21 1050 03/08/21 1106 03/09/21 0648  NA 138 141  --   K 4.3 4.1 3.8  CL 103 104  --   CO2 25  --   --   GLUCOSE 129* 127*  --   BUN 21 23  --   CREATININE 1.14 1.10  --   CALCIUM 9.7  --   --   MG  --   --  2.0    GFR: Estimated Creatinine Clearance: 66.6 mL/min (by C-G formula based on SCr of 1.1 mg/dL).  Liver Function Tests: Recent Labs  Lab 03/08/21 1050  AST 31  ALT 21  ALKPHOS 48  BILITOT 0.7  PROT 7.1  ALBUMIN 4.4    CBG: Recent Labs  Lab 03/08/21 1046  GLUCAP 119*     Recent Results (from the past 240 hour(s))  Resp Panel by RT-PCR (Flu A&B, Covid) Nasopharyngeal Swab     Status: None   Collection Time: 03/08/21  7:51 PM   Specimen: Nasopharyngeal Swab; Nasopharyngeal(NP) swabs in vial transport medium  Result Value Ref Range Status   SARS Coronavirus 2 by RT PCR NEGATIVE NEGATIVE Final    Comment: (NOTE) SARS-CoV-2 target nucleic acids are NOT DETECTED.  The SARS-CoV-2 RNA is generally detectable in upper respiratory specimens during the acute phase of infection. The lowest concentration of SARS-CoV-2 viral copies this assay can detect is 138 copies/mL. A negative result does not preclude SARS-Cov-2 infection and should not be used as the sole basis for treatment or other patient management decisions. A negative result may occur with   improper specimen collection/handling, submission of specimen other than nasopharyngeal swab, presence of viral mutation(s) within the areas targeted by this assay, and inadequate number of viral copies(<138 copies/mL). A negative result must be combined with clinical observations, patient history, and epidemiological information. The expected result is Negative.  Fact Sheet for Patients:  EntrepreneurPulse.com.au  Fact Sheet for Healthcare Providers:  IncredibleEmployment.be  This test is no t yet approved or cleared by the [place] FDA and  has been authorized for detection and/or diagnosis of SARS-CoV-2 by FDA under an Emergency Use Authorization (EUA). This EUA will remain  in effect (meaning this test can be used) for the duration of the COVID-19 declaration under Section 564(b)(1) of the Act, 21 U.S.C.section 360bbb-3(b)(1), unless the  authorization is terminated  or revoked sooner.       Influenza A by PCR NEGATIVE NEGATIVE Final   Influenza B by PCR NEGATIVE NEGATIVE Final    Comment: (NOTE) The Xpert Xpress SARS-CoV-2/FLU/RSV plus assay is intended as an aid in the diagnosis of influenza from Nasopharyngeal swab specimens and should not be used as a sole basis for treatment. Nasal washings and aspirates are unacceptable for Xpert Xpress SARS-CoV-2/FLU/RSV testing.  Fact Sheet for Patients: EntrepreneurPulse.com.au  Fact Sheet for Healthcare Providers: IncredibleEmployment.be  This test is not yet approved or cleared by the [place] FDA and has been authorized for detection and/or diagnosis of SARS-CoV-2 by FDA under an Emergency Use Authorization (EUA). This EUA will remain in effect (meaning this test can be used) for the duration of the COVID-19 declaration under Section 564(b)(1) of the Act, 21 U.S.C. section 360bbb-3(b)(1), unless the authorization is terminated  or revoked.  Performed at [facility], [place] [address], [place], [place] [address]          Radiology Studies: MR ANGIO HEAD WO CONTRAST  Result Date: 03/08/2021 CLINICAL DATA:  Multiple episodes of left vision loss EXAM: MRI HEAD WITHOUT CONTRAST MRA HEAD WITHOUT CONTRAST MRA NECK WITHOUT AND WITH CONTRAST TECHNIQUE: Multiplanar, multi-echo pulse sequences of the brain and surrounding structures were acquired without intravenous contrast. Angiographic images of the Circle of Willis were acquired using MRA technique without intravenous contrast. Angiographic images of the neck were acquired using MRA technique without and with intravenous contrast. Carotid stenosis measurements (when applicable) are obtained utilizing NASCET criteria, using the distal internal carotid diameter as the denominator. CONTRAST:  8.64mL GADAVIST GADOBUTROL 1 MMOL/ML IV SOLN COMPARISON:  None. FINDINGS: MRI HEAD Brain: There is no acute infarction or intracranial hemorrhage. There is no intracranial mass, mass effect, or edema. There is no hydrocephalus or extra-axial fluid collection. Prominence of the ventricles and sulci reflects mild generalized parenchymal volume loss. Patchy foci of T2 hyperintensity in the supratentorial white matter nonspecific may reflect mild chronic microvascular ischemic changes. Vascular: Major vessel flow voids at the skull base are preserved. Skull and upper cervical spine: Normal marrow signal is preserved. Sinuses/Orbits: Minor mucosal thickening. Bilateral lens replacements. Other: Sella is partially empty.  Mastoid air cells are clear. MRA HEAD Intracranial internal carotid arteries are patent. Middle and anterior cerebral arteries are patent. Intracranial vertebral arteries, basilar artery, posterior cerebral arteries are patent. Right posterior communicating artery is present with fetal origin of the right PCA. There is no significant stenosis or aneurysm. MRA NECK Great vessel  origins are patent. There is no proximal subclavian artery stenosis. Common, internal, and external carotid arteries are patent. There is no stenosis or evidence of dissection. Codominant vertebral arteries are patent. No stenosis or evidence of dissection. IMPRESSION: No acute infarction, hemorrhage, or mass. Mild chronic microvascular ischemic changes. No proximal intracranial vessel occlusion or significant stenosis. No large vessel occlusion or hemodynamically significant stenosis in the neck. Electronically Signed   By: Macy Mis M.D.   On: 03/08/2021 15:55   MR Angiogram Neck W or Wo Contrast  Result Date: 03/08/2021 CLINICAL DATA:  Multiple episodes of left vision loss EXAM: MRI HEAD WITHOUT CONTRAST MRA HEAD WITHOUT CONTRAST MRA NECK WITHOUT AND WITH CONTRAST TECHNIQUE: Multiplanar, multi-echo pulse sequences of the brain and surrounding structures were acquired without intravenous contrast. Angiographic images of the Circle of Willis were acquired using MRA technique without intravenous contrast. Angiographic images of the neck were acquired using MRA  technique without and with intravenous contrast. Carotid stenosis measurements (when applicable) are obtained utilizing NASCET criteria, using the distal internal carotid diameter as the denominator. CONTRAST:  8.68mL GADAVIST GADOBUTROL 1 MMOL/ML IV SOLN COMPARISON:  None. FINDINGS: MRI HEAD Brain: There is no acute infarction or intracranial hemorrhage. There is no intracranial mass, mass effect, or edema. There is no hydrocephalus or extra-axial fluid collection. Prominence of the ventricles and sulci reflects mild generalized parenchymal volume loss. Patchy foci of T2 hyperintensity in the supratentorial white matter nonspecific may reflect mild chronic microvascular ischemic changes. Vascular: Major vessel flow voids at the skull base are preserved. Skull and upper cervical spine: Normal marrow signal is preserved. Sinuses/Orbits: Minor mucosal  thickening. Bilateral lens replacements. Other: Sella is partially empty.  Mastoid air cells are clear. MRA HEAD Intracranial internal carotid arteries are patent. Middle and anterior cerebral arteries are patent. Intracranial vertebral arteries, basilar artery, posterior cerebral arteries are patent. Right posterior communicating artery is present with fetal origin of the right PCA. There is no significant stenosis or aneurysm. MRA NECK Great vessel origins are patent. There is no proximal subclavian artery stenosis. Common, internal, and external carotid arteries are patent. There is no stenosis or evidence of dissection. Codominant vertebral arteries are patent. No stenosis or evidence of dissection. IMPRESSION: No acute infarction, hemorrhage, or mass. Mild chronic microvascular ischemic changes. No proximal intracranial vessel occlusion or significant stenosis. No large vessel occlusion or hemodynamically significant stenosis in the neck. Electronically Signed   By: Macy Mis M.D.   On: 03/08/2021 15:55   MR BRAIN WO CONTRAST  Result Date: 03/08/2021 CLINICAL DATA:  Multiple episodes of left vision loss EXAM: MRI HEAD WITHOUT CONTRAST MRA HEAD WITHOUT CONTRAST MRA NECK WITHOUT AND WITH CONTRAST TECHNIQUE: Multiplanar, multi-echo pulse sequences of the brain and surrounding structures were acquired without intravenous contrast. Angiographic images of the Circle of Willis were acquired using MRA technique without intravenous contrast. Angiographic images of the neck were acquired using MRA technique without and with intravenous contrast. Carotid stenosis measurements (when applicable) are obtained utilizing NASCET criteria, using the distal internal carotid diameter as the denominator. CONTRAST:  8.93mL GADAVIST GADOBUTROL 1 MMOL/ML IV SOLN COMPARISON:  None. FINDINGS: MRI HEAD Brain: There is no acute infarction or intracranial hemorrhage. There is no intracranial mass, mass effect, or edema. There is no  hydrocephalus or extra-axial fluid collection. Prominence of the ventricles and sulci reflects mild generalized parenchymal volume loss. Patchy foci of T2 hyperintensity in the supratentorial white matter nonspecific may reflect mild chronic microvascular ischemic changes. Vascular: Major vessel flow voids at the skull base are preserved. Skull and upper cervical spine: Normal marrow signal is preserved. Sinuses/Orbits: Minor mucosal thickening. Bilateral lens replacements. Other: Sella is partially empty.  Mastoid air cells are clear. MRA HEAD Intracranial internal carotid arteries are patent. Middle and anterior cerebral arteries are patent. Intracranial vertebral arteries, basilar artery, posterior cerebral arteries are patent. Right posterior communicating artery is present with fetal origin of the right PCA. There is no significant stenosis or aneurysm. MRA NECK Great vessel origins are patent. There is no proximal subclavian artery stenosis. Common, internal, and external carotid arteries are patent. There is no stenosis or evidence of dissection. Codominant vertebral arteries are patent. No stenosis or evidence of dissection. IMPRESSION: No acute infarction, hemorrhage, or mass. Mild chronic microvascular ischemic changes. No proximal intracranial vessel occlusion or significant stenosis. No large vessel occlusion or hemodynamically significant stenosis in the neck. Electronically Signed   By: Malachi Carl  Patel M.D.   On: 03/08/2021 15:55        Scheduled Meds:  atorvastatin  40 mg Oral QHS   clopidogrel  75 mg Oral Daily   enoxaparin (LOVENOX) injection  40 mg Subcutaneous QHS   FLUoxetine  40 mg Oral Daily   hydrocerin  1 application Topical QHS   loratadine  10 mg Oral Daily   omega-3 acid ethyl esters  1 g Oral BID   vitamin B-12  1,000 mcg Oral Daily   Continuous Infusions:   LOS: 0 days    Time spent: 35 minutes    Irine Seal, MD [facility]   To contact the  attending provider between 7A-7P or the covering provider during after hours 7P-7A, please log into the web site [email or web address] and access using universal [place] password for that web site. If you do not have the password, please call the hospital operator.  03/09/2021, 2:07 PM

## 2021-03-09 NOTE — Progress Notes (Signed)
PHARMACIST - PHYSICIAN ORDER COMMUNICATION  CONCERNING: P&T Medication Policy on Herbal Medications  DESCRIPTION:  This patient's order for:  Co Q-10  has been noted.  This product(s) is classified as an "herbal" or natural product. Due to a lack of definitive safety studies or FDA approval, nonstandard manufacturing practices, plus the potential risk of unknown drug-drug interactions while on inpatient medications, the Pharmacy and Therapeutics Committee does not permit the use of "herbal" or natural products of this type within [facility].   ACTION TAKEN: The pharmacy department is unable to verify this order at this time. Please reevaluate patient's clinical condition at discharge and address if the herbal or natural product(s) should be resumed at that time.   Arty Baumgartner, [place] 03/09/2021 10:28 AM

## 2021-03-09 NOTE — Progress Notes (Addendum)
  Echocardiogram 2D Echocardiogram has been performed.  Elmer Ramp 03/09/2021, 3:49 PM

## 2021-03-09 NOTE — Progress Notes (Addendum)
STROKE TEAM PROGRESS NOTE   INTERVAL HISTORY No overnight events or new complaints  Vitals:   03/09/21 0230 03/09/21 0430 03/09/21 0630 03/09/21 0758  BP: 140/75 133/74 136/85 117/72  Pulse: (!) 54 (!) 58 (!) 57 63  Resp: 18 17 18 16   Temp: 97.9 F (36.6 C) 97.7 F (36.5 C) 97.8 F (36.6 C) 97.9 F (36.6 C)  TempSrc: Oral Oral Oral Oral  SpO2: 97% 94% 96% 96%  Weight:      Height:       CBC:  Recent Labs  Lab 03/08/21 1050 03/08/21 1106  WBC 9.1  --   NEUTROABS 6.4  --   HGB 14.4 14.6  HCT 43.3 43.0  MCV 93.9  --   PLT 250  --    Basic Metabolic Panel:  Recent Labs  Lab 03/08/21 1050 03/08/21 1106 03/09/21 0648  NA 138 141  --   K 4.3 4.1 3.8  CL 103 104  --   CO2 25  --   --   GLUCOSE 129* 127*  --   BUN 21 23  --   CREATININE 1.14 1.10  --   CALCIUM 9.7  --   --   MG  --   --  2.0   Lipid Panel:  Recent Labs  Lab 03/09/21 0648  CHOL 209*  TRIG 135  HDL 50  CHOLHDL 4.2  VLDL 27  LDLCALC 132*   HgbA1c: No results for input(s): HGBA1C in the last 168 hours. Urine Drug Screen: No results for input(s): LABOPIA, COCAINSCRNUR, LABBENZ, AMPHETMU, THCU, LABBARB in the last 168 hours.  Alcohol Level No results for input(s): ETH in the last 168 hours.  IMAGING past 24 hours MR ANGIO HEAD WO CONTRAST  Result Date: 03/08/2021 CLINICAL DATA:  Multiple episodes of left vision loss EXAM: MRI HEAD WITHOUT CONTRAST MRA HEAD WITHOUT CONTRAST MRA NECK WITHOUT AND WITH CONTRAST TECHNIQUE: Multiplanar, multi-echo pulse sequences of the brain and surrounding structures were acquired without intravenous contrast. Angiographic images of the Circle of Willis were acquired using MRA technique without intravenous contrast. Angiographic images of the neck were acquired using MRA technique without and with intravenous contrast. Carotid stenosis measurements (when applicable) are obtained utilizing NASCET criteria, using the distal internal carotid diameter as the denominator.  CONTRAST:  8.95mL GADAVIST GADOBUTROL 1 MMOL/ML IV SOLN COMPARISON:  None. FINDINGS: MRI HEAD Brain: There is no acute infarction or intracranial hemorrhage. There is no intracranial mass, mass effect, or edema. There is no hydrocephalus or extra-axial fluid collection. Prominence of the ventricles and sulci reflects mild generalized parenchymal volume loss. Patchy foci of T2 hyperintensity in the supratentorial white matter nonspecific may reflect mild chronic microvascular ischemic changes. Vascular: Major vessel flow voids at the skull base are preserved. Skull and upper cervical spine: Normal marrow signal is preserved. Sinuses/Orbits: Minor mucosal thickening. Bilateral lens replacements. Other: Sella is partially empty.  Mastoid air cells are clear. MRA HEAD Intracranial internal carotid arteries are patent. Middle and anterior cerebral arteries are patent. Intracranial vertebral arteries, basilar artery, posterior cerebral arteries are patent. Right posterior communicating artery is present with fetal origin of the right PCA. There is no significant stenosis or aneurysm. MRA NECK Great vessel origins are patent. There is no proximal subclavian artery stenosis. Common, internal, and external carotid arteries are patent. There is no stenosis or evidence of dissection. Codominant vertebral arteries are patent. No stenosis or evidence of dissection. IMPRESSION: No acute infarction, hemorrhage, or mass. Mild chronic microvascular ischemic  changes. No proximal intracranial vessel occlusion or significant stenosis. No large vessel occlusion or hemodynamically significant stenosis in the neck. Electronically Signed   By: Macy Mis M.D.   On: 03/08/2021 15:55   MR Angiogram Neck W or Wo Contrast  Result Date: 03/08/2021 CLINICAL DATA:  Multiple episodes of left vision loss EXAM: MRI HEAD WITHOUT CONTRAST MRA HEAD WITHOUT CONTRAST MRA NECK WITHOUT AND WITH CONTRAST TECHNIQUE: Multiplanar, multi-echo pulse  sequences of the brain and surrounding structures were acquired without intravenous contrast. Angiographic images of the Circle of Willis were acquired using MRA technique without intravenous contrast. Angiographic images of the neck were acquired using MRA technique without and with intravenous contrast. Carotid stenosis measurements (when applicable) are obtained utilizing NASCET criteria, using the distal internal carotid diameter as the denominator. CONTRAST:  8.53mL GADAVIST GADOBUTROL 1 MMOL/ML IV SOLN COMPARISON:  None. FINDINGS: MRI HEAD Brain: There is no acute infarction or intracranial hemorrhage. There is no intracranial mass, mass effect, or edema. There is no hydrocephalus or extra-axial fluid collection. Prominence of the ventricles and sulci reflects mild generalized parenchymal volume loss. Patchy foci of T2 hyperintensity in the supratentorial white matter nonspecific may reflect mild chronic microvascular ischemic changes. Vascular: Major vessel flow voids at the skull base are preserved. Skull and upper cervical spine: Normal marrow signal is preserved. Sinuses/Orbits: Minor mucosal thickening. Bilateral lens replacements. Other: Sella is partially empty.  Mastoid air cells are clear. MRA HEAD Intracranial internal carotid arteries are patent. Middle and anterior cerebral arteries are patent. Intracranial vertebral arteries, basilar artery, posterior cerebral arteries are patent. Right posterior communicating artery is present with fetal origin of the right PCA. There is no significant stenosis or aneurysm. MRA NECK Great vessel origins are patent. There is no proximal subclavian artery stenosis. Common, internal, and external carotid arteries are patent. There is no stenosis or evidence of dissection. Codominant vertebral arteries are patent. No stenosis or evidence of dissection. IMPRESSION: No acute infarction, hemorrhage, or mass. Mild chronic microvascular ischemic changes. No proximal  intracranial vessel occlusion or significant stenosis. No large vessel occlusion or hemodynamically significant stenosis in the neck. Electronically Signed   By: Macy Mis M.D.   On: 03/08/2021 15:55   MR BRAIN WO CONTRAST  Result Date: 03/08/2021 CLINICAL DATA:  Multiple episodes of left vision loss EXAM: MRI HEAD WITHOUT CONTRAST MRA HEAD WITHOUT CONTRAST MRA NECK WITHOUT AND WITH CONTRAST TECHNIQUE: Multiplanar, multi-echo pulse sequences of the brain and surrounding structures were acquired without intravenous contrast. Angiographic images of the Circle of Willis were acquired using MRA technique without intravenous contrast. Angiographic images of the neck were acquired using MRA technique without and with intravenous contrast. Carotid stenosis measurements (when applicable) are obtained utilizing NASCET criteria, using the distal internal carotid diameter as the denominator. CONTRAST:  8.22mL GADAVIST GADOBUTROL 1 MMOL/ML IV SOLN COMPARISON:  None. FINDINGS: MRI HEAD Brain: There is no acute infarction or intracranial hemorrhage. There is no intracranial mass, mass effect, or edema. There is no hydrocephalus or extra-axial fluid collection. Prominence of the ventricles and sulci reflects mild generalized parenchymal volume loss. Patchy foci of T2 hyperintensity in the supratentorial white matter nonspecific may reflect mild chronic microvascular ischemic changes. Vascular: Major vessel flow voids at the skull base are preserved. Skull and upper cervical spine: Normal marrow signal is preserved. Sinuses/Orbits: Minor mucosal thickening. Bilateral lens replacements. Other: Sella is partially empty.  Mastoid air cells are clear. MRA HEAD Intracranial internal carotid arteries are patent. Middle and anterior cerebral  arteries are patent. Intracranial vertebral arteries, basilar artery, posterior cerebral arteries are patent. Right posterior communicating artery is present with fetal origin of the right  PCA. There is no significant stenosis or aneurysm. MRA NECK Great vessel origins are patent. There is no proximal subclavian artery stenosis. Common, internal, and external carotid arteries are patent. There is no stenosis or evidence of dissection. Codominant vertebral arteries are patent. No stenosis or evidence of dissection. IMPRESSION: No acute infarction, hemorrhage, or mass. Mild chronic microvascular ischemic changes. No proximal intracranial vessel occlusion or significant stenosis. No large vessel occlusion or hemodynamically significant stenosis in the neck. Electronically Signed   By: Macy Mis M.D.   On: 03/08/2021 15:55    Echocardiogram   1. Left ventricular ejection fraction, by estimation, is 60 to 65%. The  left ventricle has normal function. The left ventricle has no regional  wall motion abnormalities. Left ventricular diastolic parameters are  consistent with Grade I diastolic  dysfunction (impaired relaxation).   2. Right ventricular systolic function is normal. The right ventricular  size is normal. Tricuspid regurgitation signal is inadequate for assessing  PA pressure.   3. The mitral valve is normal in structure. Trivial mitral valve  regurgitation.   4. The aortic valve is tricuspid. There is mild calcification of the  aortic valve. There is mild thickening of the aortic valve. Aortic valve  regurgitation is not visualized. Mild aortic valve sclerosis is present,  with no evidence of aortic valve  stenosis.   Comparison(s): No significant change from prior study.   Conclusion(s)/Recommendation(s): No intracardiac source of embolism  detected on this transthoracic study. A transesophageal echocardiogram is  recommended to exclude cardiac source of embolism if clinically indicated.   FINDINGS   Left Ventricle: Left ventricular ejection fraction, by estimation, is 60  to 65%. The left ventricle has normal function. The left ventricle has no  regional wall  motion abnormalities. The left ventricular internal cavity  size was normal in size. There is   no left ventricular hypertrophy. Left ventricular diastolic parameters  are consistent with Grade I diastolic dysfunction (impaired relaxation).   Right Ventricle: The right ventricular size is normal. No increase in  right ventricular wall thickness. Right ventricular systolic function is  normal. Tricuspid regurgitation signal is inadequate for assessing PA  pressure.   Left Atrium: Left atrial size was normal in size.   Right Atrium: Right atrial size was normal in size.   Pericardium: There is no evidence of pericardial effusion.   Mitral Valve: The mitral valve is normal in structure. There is mild  thickening of the mitral valve leaflet(s). There is mild calcification of  the mitral valve leaflet(s). Mild mitral annular calcification. Trivial  mitral valve regurgitation.   Tricuspid Valve: The tricuspid valve is normal in structure. Tricuspid  valve regurgitation is trivial.   Aortic Valve: The aortic valve is tricuspid. There is mild calcification  of the aortic valve. There is mild thickening of the aortic valve. Aortic  valve regurgitation is not visualized. Mild aortic valve sclerosis is  present, with no evidence of aortic  valve stenosis.   Pulmonic Valve: The pulmonic valve was not well visualized. Pulmonic valve  regurgitation is trivial.   Aorta: The aortic root is normal in size and structure.   IAS/Shunts: No atrial level shunt detected by color flow Doppler.      LEFT VENTRICLE  PLAX 2D  LVIDd:         4.80 cm  Diastology  LVIDs:         3.20 cm  LV e' medial:    7.40 cm/s  LV PW:         1.00 cm  LV E/e' medial:  11.6  LV IVS:        0.80 cm  LV e' lateral:   11.30 cm/s  LVOT diam:     2.00 cm  LV E/e' lateral: 7.6  LV SV:         77  LV SV Index:   38  LVOT Area:     3.14 cm      RIGHT VENTRICLE  RV Basal diam:  4.00 cm  RV Mid diam:    3.70 cm    LEFT ATRIUM             Index       RIGHT ATRIUM           Index  LA diam:        3.90 cm 1.91 cm/m  RA Area:     16.30 cm  LA Vol (A2C):   51.5 ml 25.29 ml/m RA Volume:   39.60 ml  19.44 ml/m  LA Vol (A4C):   65.0 ml 31.91 ml/m  LA Biplane Vol: 62.6 ml 30.74 ml/m   AORTIC VALVE  LVOT Vmax:   135.00 cm/s  LVOT Vmean:  88.200 cm/s  LVOT VTI:    0.245 m     AORTA  Ao Root diam: 3.10 cm   MITRAL VALVE  MV Area (PHT): 2.92 cm     SHUNTS  MV Decel Time: 260 msec     Systemic VTI:  0.24 m  MV E velocity: 85.90 cm/s   Systemic Diam: 2.00 cm  MV A velocity: 101.00 cm/s  MV E/A ratio:  0.85   PHYSICAL EXAM Mental Status: Patient is awake, alert, oriented to person, place, month, year, and situation. Patient is able to give a clear and coherent history. Speech fluent, intact comprehension and repetition. No signs of aphasia or neglect. Visual Fields are full. Pupils are equal, round, and reactive to light. EOMI without ptosis or diploplia.  Facial sensation is symmetric to temperature Facial movement is symmetric.  Hearing is intact to voice. Uvula midline and palate elevates symmetrically. Shoulder shrug is symmetric. Tongue is midline without atrophy or fasciculations.  Tone is normal. Bulk is normal. 5/5 strength was present in all four extremities. Sensation is symmetric to light touch and temperature in the arms and legs. Deep Tendon Reflexes: 2+ and symmetric in the biceps and patellae. Toes are downgoing bilaterally. FNF and HKS are intact bilaterally. Gait - normal  ASSESSMENT/PLAN Mr. Douglas Erickson is a 75 y.o. male with history of DM 2, HTN, HLD, depression admitted for recurrent episodes of transient visual disturbance left eye amaurosis fugax /TIA. 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 Vision back to baseline.  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 MRI  did  not show evidence of acute infarct MRA  did not show large vessel occlusion or flow limiting stenosis. 2D Echo - [id number], no IAS, LVT LDL 132 HgbA1c - 01/2021 6.6% by patient report. VTE prophylaxis - Enoxaparin    Diet   Diet heart healthy/carb modified Room service appropriate? Yes; Fluid consistency: Thin   aspirin 81 mg daily discontinued by hematologist prior to admission, now on clopidogrel 75 mg daily. We discussed that  hematology was not certain about diagnosis was not certain. Consider aspirin and clopidogrel for 21 days and discontinue one agent either aspirin or clopidogrel. Therapy recommendations: Continue  Disposition:  home  Hypertension Stable Permissive hypertension gradually normalize in 5-7 days Long-term BP goal normotensive  Hyperlipidemia LDL 132, goal < 70 High intensity atorvastatin Continue statin at discharge  Diabetes type II Controlled HgbA1c Last A1c 6.6% in May 2022, goal < 7.0 CBGs Recent Labs    03/08/21 1046  GLUCAP 119*  SSI  Follow up outpatient neurology.  Hospital day # 0  Stroke workup is complete and we discussed risk factor modification such as LDL, BP and antiplatelets.  Please call for any further questions.     Lynnae Sandhoff, MD Page: [phone] Stroke Neurology  To contact Stroke Continuity provider, please refer to [email or web address] After hours, contact General Neurology

## 2021-03-09 NOTE — Evaluation (Signed)
Physical Therapy Evaluation Patient Details Name: LEGRANDE HAO MRN: [id number] DOB: 07-Dec-1945 Today's Date: 03/09/2021   History of Present Illness  The pt is a 75 yo male presenting from opthamologist 6/10 with pressure behind L eye followed by transient L visual field loss. Imaging reveals no acute abnormality. PMH includes: HLD, depression/anxiety, HTN, kidney disease, and DM II.   Clinical Impression  Pt in bed upon arrival of PT, agreeable to evaluation at this time. Prior to admission the pt was completely independent with all mobility without need for AD and was independent with all ADLs and IADLs. The pt now presents without limitations in functional strength, stability, coordination, or strength at this time. The pt denies any changes in sensation, strength, and reports all vision changes have resolved. The pt was able to demo good independence with all mobility, gait, and stairs at this time. No further acute PT needs, will sign off at this time. Thank you for the consult.     Follow Up Recommendations No PT follow up    Equipment Recommendations  None recommended by PT    Recommendations for Other Services       Precautions / Restrictions Precautions Precautions: None Restrictions Weight Bearing Restrictions: No      Mobility  Bed Mobility Overal bed mobility: Independent                  Transfers Overall transfer level: Independent Equipment used: None             General transfer comment: no evidence of instability, no assist needed  Ambulation/Gait Ambulation/Gait assistance: Independent Gait Distance (Feet): 500 Feet Assistive device: None Gait Pattern/deviations: WFL(Within Functional Limits) Gait velocity: 1.3 m/s Gait velocity interpretation: >4.37 ft/sec, indicative of normal walking speed General Gait Details: normal speed, no evidence of instability  Stairs Stairs: Yes Stairs assistance: Independent Stair Management: No  rails;Alternating pattern Number of Stairs: 12 General stair comments: very quick, steady, no AD  Wheelchair Mobility    Modified Rankin (Stroke Patients Only) Modified Rankin (Stroke Patients Only) Pre-Morbid Rankin Score: No symptoms Modified Rankin: Moderate disability     Balance Overall balance assessment: Independent                               Standardized Balance Assessment Standardized Balance Assessment : Dynamic Gait Index   Dynamic Gait Index Level Surface: Normal Change in Gait Speed: Normal Gait with Horizontal Head Turns: Normal Gait with Vertical Head Turns: Mild Impairment Gait and Pivot Turn: Normal Step Over Obstacle: Normal Step Around Obstacles: Normal Steps: Normal Total Score: 23       Pertinent Vitals/Pain Pain Assessment: No/denies pain    Home Living Family/patient expects to be discharged to:: Private residence Living Arrangements: Spouse/significant other Available Help at Discharge: Family Type of Home: House Home Access: Stairs to enter Entrance Stairs-Rails: Psychiatric nurse of Steps: 4-6 Home Layout: Multi-level Home Equipment: None      Prior Function Level of Independence: Independent         Comments: pt retired [employer] secretary, 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     Hand Dominance        Extremity/Trunk Assessment   Upper Extremity Assessment Upper Extremity Assessment: Overall WFL for tasks assessed    Lower Extremity Assessment Lower Extremity Assessment: Overall WFL for tasks assessed    Cervical / Trunk Assessment Cervical / Trunk Assessment: Normal  Communication  Communication: No difficulties  Cognition Arousal/Alertness: Awake/alert Behavior During Therapy: WFL for tasks assessed/performed Overall Cognitive Status: Within Functional Limits for tasks assessed                                        General Comments General comments (skin integrity, edema,  etc.): VSS    Exercises     Assessment/Plan    PT Assessment Patent does not need any further PT services  PT Problem List         PT Treatment Interventions      PT Goals (Current goals can be found in the Care Plan section)  Acute Rehab PT Goals Patient Stated Goal: return home PT Goal Formulation: With patient Time For Goal Achievement: 03/23/21 Potential to Achieve Goals: Good     AM-PAC PT "6 Clicks" Mobility  Outcome Measure Help needed turning from your back to your side while in a flat bed without using bedrails?: None Help needed moving from lying on your back to sitting on the side of a flat bed without using bedrails?: None Help needed moving to and from a bed to a chair (including a wheelchair)?: None Help needed standing up from a chair using your arms (e.g., wheelchair or bedside chair)?: None Help needed to walk in hospital room?: None Help needed climbing 3-5 steps with a railing? : None 6 Click Score: 24    End of Session Equipment Utilized During Treatment: Gait belt Activity Tolerance: Patient tolerated treatment well Patient left: in bed;with call bell/phone within reach Nurse Communication: Mobility status PT Visit Diagnosis: Other abnormalities of gait and mobility (R26.89)    Time: 6203-5597 PT Time Calculation (min) (ACUTE ONLY): 16 min   Charges:   PT Evaluation $PT Eval Low Complexity: 1 Low          Karma Ganja, PT, DPT   Acute Rehabilitation Department Pager #: [phone]  Otho Bellows 03/09/2021, 10:56 AM

## 2021-03-10 DIAGNOSIS — G453 Amaurosis fugax: Secondary | ICD-10-CM | POA: Diagnosis not present

## 2021-03-10 DIAGNOSIS — H539 Unspecified visual disturbance: Secondary | ICD-10-CM

## 2021-03-10 DIAGNOSIS — E119 Type 2 diabetes mellitus without complications: Secondary | ICD-10-CM | POA: Diagnosis not present

## 2021-03-10 DIAGNOSIS — E785 Hyperlipidemia, unspecified: Secondary | ICD-10-CM | POA: Diagnosis not present

## 2021-03-10 LAB — BASIC METABOLIC PANEL
Anion gap: 9 (ref 5–15)
BUN: 20 mg/dL (ref 8–23)
CO2: 25 mmol/L (ref 22–32)
Calcium: 9.5 mg/dL (ref 8.9–10.3)
Chloride: 104 mmol/L (ref 98–111)
Creatinine, Ser: 1.11 mg/dL (ref 0.61–1.24)
GFR, Estimated: >60 mL/min (ref >60)
Glucose, Bld: 105 mg/dL — ABNORMAL HIGH (ref 70–99)
Potassium: 5.5 mmol/L — ABNORMAL HIGH (ref 3.5–5.1)
Sodium: 138 mmol/L (ref 135–145)

## 2021-03-10 LAB — POTASSIUM: Potassium: 3.7 mmol/L (ref 3.5–5.1)

## 2021-03-10 MED ORDER — CLOPIDOGREL BISULFATE 75 MG PO TABS: 75.0000 mg | ORAL_TABLET | Freq: Every day | ORAL | 1 refills | Status: DC

## 2021-03-10 MED ORDER — ACETAMINOPHEN 325 MG PO TABS: 650.0000 mg | ORAL_TABLET | ORAL | Status: AC | PRN

## 2021-03-10 MED ORDER — ATORVASTATIN CALCIUM 40 MG PO TABS: 40.0000 mg | ORAL_TABLET | Freq: Every day | ORAL | 1 refills | Status: DC

## 2021-03-10 MED ORDER — ASPIRIN EC 81 MG PO TBEC
81.0000 mg | DELAYED_RELEASE_TABLET | Freq: Every day | ORAL | Status: DC
  Administered 2021-03-10: 81 mg via ORAL
  Filled 2021-03-10: qty 1

## 2021-03-10 MED ORDER — ROSUVASTATIN CALCIUM 5 MG PO TABS: 5.0000 mg | ORAL_TABLET | Freq: Every day | ORAL | 1 refills | Status: AC

## 2021-03-10 MED ORDER — CLOPIDOGREL BISULFATE 75 MG PO TABS: 75.0000 mg | ORAL_TABLET | Freq: Every day | ORAL | 1 refills | Status: AC

## 2021-03-10 MED ORDER — ASPIRIN 81 MG PO TBEC: 81.0000 mg | DELAYED_RELEASE_TABLET | Freq: Every day | ORAL | 11 refills | Status: AC

## 2021-03-10 NOTE — Discharge Summary (Addendum)
Physician Discharge Summary  Douglas Erickson:[id number] DOB: [birth year] DOA: 03/08/2021  PCP: Douglas Blitz, MD  Admit date: 03/08/2021 Discharge date: 03/10/2021  Time spent: 50 minutes  Recommendations for Outpatient Follow-up:  Follow-up with Douglas Erickson, [facility] neurology in 4 weeks. Follow-up with Douglas Blitz, MD in 2 weeks.  On follow-up patient will need a basic metabolic profile done to follow-up on electrolytes and renal function.  Patient's diabetes will need to be reassessed as well as elevated blood pressure.  Patient statin noted to have been changed to Crestor.   Discharge Diagnoses:  Principal Problem:   Amaurosis fugax of left eye Active Problems:   DM (diabetes mellitus) ([place])   Hyperlipidemia   Depression with anxiety   Hypertension   Visual changes   Discharge Condition: Stable and improved  Diet recommendation: Heart healthy  Filed Weights   03/08/21 1253  Weight: 81.6 kg    History of present illness:  HPI per Douglas Erickson Douglas Erickson is a 75 y.o. male with medical history significant for hyperlipidemia, depression/anxiety who presented to the ED from his ophthalmology office for TIA/stroke work-up in the setting of amaurosis fugax.   Patient stated yesterday (03/07/2021) he was working on his router and struggling with wiring when he suddenly developed a pressure-like sensation in his left eye.  Afterwards he developed transient left visual field loss with complete heart vision.  This occurred initially after he bent over.  Symptoms resolved after about 1 minute.  He says this happened a total of 4 times each time of resolving on its own.  He saw his ophthalmologist, Douglas Erickson, earlier today.  Exam was consistent with amaurosis fugax and there was concern for left-sided TIA.  He was given aspirin 325 mg and advised to present to the ED for further evaluation.  Patient otherwise denies any associated lightheadedness/dizziness, headache, diaphoresis,  nausea, vomiting, chest pain, palpitations, dyspnea, focal weakness, or numbness/tingling.  He says he was previously on aspirin 81 mg daily however he was advised to discontinue it several months ago.  He has been on simvastatin 20 mg as an outpatient.  He says he was previously on the 40 mg dose however this caused him to experience shakes in his arms and weakness in his legs.  He has not had that issue with the lower 20 mg dose of simvastatin.   ED Course:  Initial vitals showed BP 150/79, pulse 61, RR 19, temp 98.2 F, SPO2 98% on room air.   Labs show WBC 9.1, hemoglobin 14.4, platelets 250,000, sodium 138, potassium 4.3, bicarb 25, BUN 21, creatinine 1.14, serum glucose 129, LFTs within normal limits.   SARS-CoV-2 PCR panel was collected and pending.   MRI brain without contrast, MRA head and neck were obtained.  No acute infarction, hemorrhage, or mass seen.  Mild chronic microvascular ischemic changes noted.  No proximal intracranial vessel occlusion or significant stenosis is seen.  No large vessel occlusion or hemodynamically significant stenosis in the neck reported.   Neurology were consulted and recommended admission for completion of stroke work-up with echocardiogram.  The hospitalist service was consulted to admit for further evaluation and management.   Hospital Course:  1 transient visual loss consistent with amaurosis fugax/TIA -Patient noted to have presented with 4 episodes of 1 to 2-minute visual loss to the left eye. -Patient noted to have been on aspirin before in the past which was subsequently discontinued by his hematologist. -Patient seen by ophthalmology in the outpatient setting and  sent to the ED for evaluation for CVA. -MRI brain, MRA head and neck done negative for any acute abnormalities or LVO. -2D echo done with EF of 60 to 65%, NWMA, grade 1 diastolic dysfunction, normal right ventricular systolic function, no intracardiac source of embolism noted.  -Fasting  lipid panel with LDL of 132. -Goal LDL < 70. -Patient's Zocor discontinued and patient started on atorvastatin 40 mg daily however patient noted some muscle twitching with atorvastatin and as such patient will be discharged on Crestor 5 mg daily with outpatient follow-up with PCP. -PT/OT/SLP. -Patient seen in consultation by neurology and patient started on Plavix 75 mg daily based on history of discontinuation of aspirin previously by hematology. -Stroke team assessed patient and recommended DAPT with aspirin 81 mg daily and Plavix 75 mg daily x21 days, then Plavix 75 mg daily as monotherapy. -Patient improved clinically and was back to baseline by day of discharge. -Outpatient follow-up with neurology.  2.  Elevated blood pressure -Patient noted initially to be hypertensive however BP normalized. -Not on antihypertensive medications in the outpatient setting.  3.  Depression with anxiety -Patient placed back on home regimen of Prozac.  4.  Hyperlipidemia -LDL 132. -Zocor changed to atorvastatin. -Patient stated atorvastatin was causing him some muscle twitching and as such we will discontinue atorvastatin and place patient on Crestor 5 mg daily on discharge. -Patient placed back on home regimen Lovaza. -Outpatient follow-up with PCP.  5.  Diet-controlled type 2 diabetes -Patient told admitting physician A1c was 6.6 by PCP last month. -Patient noted to be on diet and lifestyle modification. -Outpatient follow-up with PCP.      Procedures: MRI brain 03/08/2021 MR angiogram head and neck 03/08/2021 2D echo  03/09/2021  Consultations: Neurology: Douglas Erickson 03/08/2021  Discharge Exam: Vitals:   03/09/21 2332 03/10/21 0405  BP: (!) 145/62 138/78  Pulse: 61 (!) 58  Resp: 18 18  Temp: 97.9 F (36.6 C) 98.4 F (36.9 C)  SpO2: 96% 97%    General: NAD Cardiovascular: Regular rate rhythm no murmurs rubs or gallops.  No JVD.  No lower extremity edema. Respiratory: CTA  B.  Discharge Instructions   Discharge Instructions     Ambulatory referral to Neurology   Complete by: As directed    An appointment is requested in approximately: 1 week   Diet - low sodium heart healthy   Complete by: As directed    Increase activity slowly   Complete by: As directed       Allergies as of 03/10/2021   No Known Allergies      Medication List     STOP taking these medications    simvastatin 40 MG tablet Commonly known as: ZOCOR       TAKE these medications    acetaminophen 325 MG tablet Commonly known as: TYLENOL Take 2 tablets (650 mg total) by mouth every 4 (four) hours as needed for mild pain (or temp > 37.5 C (99.5 F)).   albuterol 108 (90 Base) MCG/ACT inhaler Commonly known as: VENTOLIN HFA Inhale 2 puffs into the lungs every 6 (six) hours as needed for shortness of breath.   APPLE CIDER VINEGAR PLUS PO Take 10 mLs by mouth daily as needed (Indigestion). Mix with water   aspirin 81 MG EC tablet Take 1 tablet (81 mg total) by mouth daily for 21 days. Swallow whole. Start taking on: March 11, 2021   atorvastatin 40 MG tablet Commonly known as: LIPITOR Take 1 tablet (40 mg  total) by mouth at bedtime.   calcipotriene 0.005 % cream Commonly known as: DOVONOX Apply 1 application topically daily.   cetirizine 10 MG tablet Commonly known as: ZYRTEC Take 10 mg by mouth daily.   clopidogrel 75 MG tablet Commonly known as: PLAVIX Take 1 tablet (75 mg total) by mouth daily. Start taking on: March 11, 2021   Co Q 10 100 MG Caps Take 100 mg by mouth at bedtime.   eucerin cream Apply 1 application topically at bedtime.   FISH OIL PO Take 2,400 mg by mouth in the morning and at bedtime.   FLUoxetine 40 MG capsule Commonly known as: PROZAC Take 40 mg by mouth daily.   Garlic 6160 MG Caps Take 2,000 mg by mouth daily.   ipratropium 0.03 % nasal spray Commonly known as: ATROVENT Place 1 spray into both nostrils daily as needed for  congestion.   MAGNESIUM CITRATE PO Take 100 mg by mouth in the morning and at bedtime.   Milk Thistle 1000 MG Caps Take 1,000 mg by mouth daily.   multivitamin capsule Take 1 capsule by mouth at bedtime.   Potassium 99 MG Tabs Take 99 mg by mouth at bedtime.   Vitamin B 12 100 MCG Lozg Take 1,000 mg by mouth daily.   VITAMIN C PO Take 200 mg by mouth daily.       No Known Allergies  Follow-up Information     Douglas Blitz, MD. Schedule an appointment as soon as possible for a visit in 2 week(s).   Specialty: Internal Medicine Contact information: [place] [address] [phone]         Garvin Fila, MD Follow up in 4 week(s).   Specialties: Neurology, Radiology Contact information: [address] [place] [address] [phone]                  The results of significant diagnostics from this hospitalization (including imaging, microbiology, ancillary and laboratory) are listed below for reference.    Significant Diagnostic Studies: MR ANGIO HEAD WO CONTRAST  Result Date: 03/08/2021 CLINICAL DATA:  Multiple episodes of left vision loss EXAM: MRI HEAD WITHOUT CONTRAST MRA HEAD WITHOUT CONTRAST MRA NECK WITHOUT AND WITH CONTRAST TECHNIQUE: Multiplanar, multi-echo pulse sequences of the brain and surrounding structures were acquired without intravenous contrast. Angiographic images of the Circle of Willis were acquired using MRA technique without intravenous contrast. Angiographic images of the neck were acquired using MRA technique without and with intravenous contrast. Carotid stenosis measurements (when applicable) are obtained utilizing NASCET criteria, using the distal internal carotid diameter as the denominator. CONTRAST:  8.52mL GADAVIST GADOBUTROL 1 MMOL/ML IV SOLN COMPARISON:  None. FINDINGS: MRI HEAD Brain: There is no acute infarction or intracranial hemorrhage. There is no intracranial mass, mass effect, or edema. There  is no hydrocephalus or extra-axial fluid collection. Prominence of the ventricles and sulci reflects mild generalized parenchymal volume loss. Patchy foci of T2 hyperintensity in the supratentorial white matter nonspecific may reflect mild chronic microvascular ischemic changes. Vascular: Major vessel flow voids at the skull base are preserved. Skull and upper cervical spine: Normal marrow signal is preserved. Sinuses/Orbits: Minor mucosal thickening. Bilateral lens replacements. Other: Sella is partially empty.  Mastoid air cells are clear. MRA HEAD Intracranial internal carotid arteries are patent. Middle and anterior cerebral arteries are patent. Intracranial vertebral arteries, basilar artery, posterior cerebral arteries are patent. Right posterior communicating artery is present with fetal origin of the right PCA. There  is no significant stenosis or aneurysm. MRA NECK Great vessel origins are patent. There is no proximal subclavian artery stenosis. Common, internal, and external carotid arteries are patent. There is no stenosis or evidence of dissection. Codominant vertebral arteries are patent. No stenosis or evidence of dissection. IMPRESSION: No acute infarction, hemorrhage, or mass. Mild chronic microvascular ischemic changes. No proximal intracranial vessel occlusion or significant stenosis. No large vessel occlusion or hemodynamically significant stenosis in the neck. Electronically Signed   By: Macy Mis M.D.   On: 03/08/2021 15:55   MR Angiogram Neck W or Wo Contrast  Result Date: 03/08/2021 CLINICAL DATA:  Multiple episodes of left vision loss EXAM: MRI HEAD WITHOUT CONTRAST MRA HEAD WITHOUT CONTRAST MRA NECK WITHOUT AND WITH CONTRAST TECHNIQUE: Multiplanar, multi-echo pulse sequences of the brain and surrounding structures were acquired without intravenous contrast. Angiographic images of the Circle of Willis were acquired using MRA technique without intravenous contrast. Angiographic images  of the neck were acquired using MRA technique without and with intravenous contrast. Carotid stenosis measurements (when applicable) are obtained utilizing NASCET criteria, using the distal internal carotid diameter as the denominator. CONTRAST:  8.13mL GADAVIST GADOBUTROL 1 MMOL/ML IV SOLN COMPARISON:  None. FINDINGS: MRI HEAD Brain: There is no acute infarction or intracranial hemorrhage. There is no intracranial mass, mass effect, or edema. There is no hydrocephalus or extra-axial fluid collection. Prominence of the ventricles and sulci reflects mild generalized parenchymal volume loss. Patchy foci of T2 hyperintensity in the supratentorial white matter nonspecific may reflect mild chronic microvascular ischemic changes. Vascular: Major vessel flow voids at the skull base are preserved. Skull and upper cervical spine: Normal marrow signal is preserved. Sinuses/Orbits: Minor mucosal thickening. Bilateral lens replacements. Other: Sella is partially empty.  Mastoid air cells are clear. MRA HEAD Intracranial internal carotid arteries are patent. Middle and anterior cerebral arteries are patent. Intracranial vertebral arteries, basilar artery, posterior cerebral arteries are patent. Right posterior communicating artery is present with fetal origin of the right PCA. There is no significant stenosis or aneurysm. MRA NECK Great vessel origins are patent. There is no proximal subclavian artery stenosis. Common, internal, and external carotid arteries are patent. There is no stenosis or evidence of dissection. Codominant vertebral arteries are patent. No stenosis or evidence of dissection. IMPRESSION: No acute infarction, hemorrhage, or mass. Mild chronic microvascular ischemic changes. No proximal intracranial vessel occlusion or significant stenosis. No large vessel occlusion or hemodynamically significant stenosis in the neck. Electronically Signed   By: Macy Mis M.D.   On: 03/08/2021 15:55   MR BRAIN WO  CONTRAST  Result Date: 03/08/2021 CLINICAL DATA:  Multiple episodes of left vision loss EXAM: MRI HEAD WITHOUT CONTRAST MRA HEAD WITHOUT CONTRAST MRA NECK WITHOUT AND WITH CONTRAST TECHNIQUE: Multiplanar, multi-echo pulse sequences of the brain and surrounding structures were acquired without intravenous contrast. Angiographic images of the Circle of Willis were acquired using MRA technique without intravenous contrast. Angiographic images of the neck were acquired using MRA technique without and with intravenous contrast. Carotid stenosis measurements (when applicable) are obtained utilizing NASCET criteria, using the distal internal carotid diameter as the denominator. CONTRAST:  8.60mL GADAVIST GADOBUTROL 1 MMOL/ML IV SOLN COMPARISON:  None. FINDINGS: MRI HEAD Brain: There is no acute infarction or intracranial hemorrhage. There is no intracranial mass, mass effect, or edema. There is no hydrocephalus or extra-axial fluid collection. Prominence of the ventricles and sulci reflects mild generalized parenchymal volume loss. Patchy foci of T2 hyperintensity in the supratentorial white matter nonspecific  may reflect mild chronic microvascular ischemic changes. Vascular: Major vessel flow voids at the skull base are preserved. Skull and upper cervical spine: Normal marrow signal is preserved. Sinuses/Orbits: Minor mucosal thickening. Bilateral lens replacements. Other: Sella is partially empty.  Mastoid air cells are clear. MRA HEAD Intracranial internal carotid arteries are patent. Middle and anterior cerebral arteries are patent. Intracranial vertebral arteries, basilar artery, posterior cerebral arteries are patent. Right posterior communicating artery is present with fetal origin of the right PCA. There is no significant stenosis or aneurysm. MRA NECK Great vessel origins are patent. There is no proximal subclavian artery stenosis. Common, internal, and external carotid arteries are patent. There is no stenosis  or evidence of dissection. Codominant vertebral arteries are patent. No stenosis or evidence of dissection. IMPRESSION: No acute infarction, hemorrhage, or mass. Mild chronic microvascular ischemic changes. No proximal intracranial vessel occlusion or significant stenosis. No large vessel occlusion or hemodynamically significant stenosis in the neck. Electronically Signed   By: Macy Mis M.D.   On: 03/08/2021 15:55   ECHOCARDIOGRAM COMPLETE  Result Date: 03/09/2021    ECHOCARDIOGRAM REPORT   Patient Name:   MUSCAB BRENNEMAN Date of Exam: 03/09/2021 Medical Rec #:  [id number]    Height:       72.0 in Accession #:    [id number]   Weight:       179.9 lb Date of Birth:  [birth year]   BSA:          2.037 m Patient Age:    45 years     BP:           133/79 mmHg Patient Gender: M            HR:           66 bpm. Exam Location:  Inpatient Procedure: 2D Echo, Cardiac Doppler and Color Doppler Indications:    TIA G45.9  History:        Patient has prior history of Echocardiogram examinations, most                 recent 07/29/2020.  Sonographer:    Merrie Roof RDCS Referring Phys: [place]  1. Left ventricular ejection fraction, by estimation, is 60 to 65%. The left ventricle has normal function. The left ventricle has no regional wall motion abnormalities. Left ventricular diastolic parameters are consistent with Grade I diastolic dysfunction (impaired relaxation).  2. Right ventricular systolic function is normal. The right ventricular size is normal. Tricuspid regurgitation signal is inadequate for assessing PA pressure.  3. The mitral valve is normal in structure. Trivial mitral valve regurgitation.  4. The aortic valve is tricuspid. There is mild calcification of the aortic valve. There is mild thickening of the aortic valve. Aortic valve regurgitation is not visualized. Mild aortic valve sclerosis is present, with no evidence of aortic valve stenosis. Comparison(s): No significant change  from prior study. Conclusion(s)/Recommendation(s): No intracardiac source of embolism detected on this transthoracic study. A transesophageal echocardiogram is recommended to exclude cardiac source of embolism if clinically indicated. FINDINGS  Left Ventricle: Left ventricular ejection fraction, by estimation, is 60 to 65%. The left ventricle has normal function. The left ventricle has no regional wall motion abnormalities. The left ventricular internal cavity size was normal in size. There is  no left ventricular hypertrophy. Left ventricular diastolic parameters are consistent with Grade I diastolic dysfunction (impaired relaxation). Right Ventricle: The right ventricular size is normal. No increase in right ventricular wall  thickness. Right ventricular systolic function is normal. Tricuspid regurgitation signal is inadequate for assessing PA pressure. Left Atrium: Left atrial size was normal in size. Right Atrium: Right atrial size was normal in size. Pericardium: There is no evidence of pericardial effusion. Mitral Valve: The mitral valve is normal in structure. There is mild thickening of the mitral valve leaflet(s). There is mild calcification of the mitral valve leaflet(s). Mild mitral annular calcification. Trivial mitral valve regurgitation. Tricuspid Valve: The tricuspid valve is normal in structure. Tricuspid valve regurgitation is trivial. Aortic Valve: The aortic valve is tricuspid. There is mild calcification of the aortic valve. There is mild thickening of the aortic valve. Aortic valve regurgitation is not visualized. Mild aortic valve sclerosis is present, with no evidence of aortic valve stenosis. Pulmonic Valve: The pulmonic valve was not well visualized. Pulmonic valve regurgitation is trivial. Aorta: The aortic root is normal in size and structure. IAS/Shunts: No atrial level shunt detected by color flow Doppler.  LEFT VENTRICLE PLAX 2D LVIDd:         4.80 cm  Diastology LVIDs:         3.20 cm   LV e' medial:    7.40 cm/s LV PW:         1.00 cm  LV E/e' medial:  11.6 LV IVS:        0.80 cm  LV e' lateral:   11.30 cm/s LVOT diam:     2.00 cm  LV E/e' lateral: 7.6 LV SV:         77 LV SV Index:   38 LVOT Area:     3.14 cm  RIGHT VENTRICLE RV Basal diam:  4.00 cm RV Mid diam:    3.70 cm LEFT ATRIUM             Index       RIGHT ATRIUM           Index LA diam:        3.90 cm 1.91 cm/m  RA Area:     16.30 cm LA Vol (A2C):   51.5 ml 25.29 ml/m RA Volume:   39.60 ml  19.44 ml/m LA Vol (A4C):   65.0 ml 31.91 ml/m LA Biplane Vol: 62.6 ml 30.74 ml/m  AORTIC VALVE LVOT Vmax:   135.00 cm/s LVOT Vmean:  88.200 cm/s LVOT VTI:    0.245 m  AORTA Ao Root diam: 3.10 cm MITRAL VALVE MV Area (PHT): 2.92 cm     SHUNTS MV Decel Time: 260 msec     Systemic VTI:  0.24 m MV E velocity: 85.90 cm/s   Systemic Diam: 2.00 cm MV A velocity: 101.00 cm/s MV E/A ratio:  0.85 Gwyndolyn Kaufman MD Electronically signed by Gwyndolyn Kaufman MD Signature Date/Time: 03/09/2021/5:41:24 PM    Final     Microbiology: Recent Results (from the past 240 hour(s))  Resp Panel by RT-PCR (Flu A&B, Covid) Nasopharyngeal Swab     Status: None   Collection Time: 03/08/21  7:51 PM   Specimen: Nasopharyngeal Swab; Nasopharyngeal(NP) swabs in vial transport medium  Result Value Ref Range Status   SARS Coronavirus 2 by RT PCR NEGATIVE NEGATIVE Final    Comment: (NOTE) SARS-CoV-2 target nucleic acids are NOT DETECTED.  The SARS-CoV-2 RNA is generally detectable in upper respiratory specimens during the acute phase of infection. The lowest concentration of SARS-CoV-2 viral copies this assay can detect is 138 copies/mL. A negative result does not preclude SARS-Cov-2 infection and should not be used  as the sole basis for treatment or other patient management decisions. A negative result may occur with  improper specimen collection/handling, submission of specimen other than nasopharyngeal swab, presence of viral mutation(s) within  the areas targeted by this assay, and inadequate number of viral copies(<138 copies/mL). A negative result must be combined with clinical observations, patient history, and epidemiological information. The expected result is Negative.  Fact Sheet for Patients:  EntrepreneurPulse.com.au  Fact Sheet for Healthcare Providers:  IncredibleEmployment.be  This test is no t yet approved or cleared by the [place] FDA and  has been authorized for detection and/or diagnosis of SARS-CoV-2 by FDA under an Emergency Use Authorization (EUA). This EUA will remain  in effect (meaning this test can be used) for the duration of the COVID-19 declaration under Section 564(b)(1) of the Act, 21 U.S.C.section 360bbb-3(b)(1), unless the authorization is terminated  or revoked sooner.       Influenza A by PCR NEGATIVE NEGATIVE Final   Influenza B by PCR NEGATIVE NEGATIVE Final    Comment: (NOTE) The Xpert Xpress SARS-CoV-2/FLU/RSV plus assay is intended as an aid in the diagnosis of influenza from Nasopharyngeal swab specimens and should not be used as a sole basis for treatment. Nasal washings and aspirates are unacceptable for Xpert Xpress SARS-CoV-2/FLU/RSV testing.  Fact Sheet for Patients: EntrepreneurPulse.com.au  Fact Sheet for Healthcare Providers: IncredibleEmployment.be  This test is not yet approved or cleared by the [place] FDA and has been authorized for detection and/or diagnosis of SARS-CoV-2 by FDA under an Emergency Use Authorization (EUA). This EUA will remain in effect (meaning this test can be used) for the duration of the COVID-19 declaration under Section 564(b)(1) of the Act, 21 U.S.C. section 360bbb-3(b)(1), unless the authorization is terminated or revoked.  Performed at [facility], [place] [address] [place], [place] [address]      Labs: Basic Metabolic Panel: Recent Labs  Lab  03/08/21 1050 03/08/21 1106 03/09/21 0648 03/10/21 0313 03/10/21 0826  NA 138 141  --  138  --   K 4.3 4.1 3.8 5.5* 3.7  CL 103 104  --  104  --   CO2 25  --   --  25  --   GLUCOSE 129* 127*  --  105*  --   BUN 21 23  --  20  --   CREATININE 1.14 1.10  --  1.11  --   CALCIUM 9.7  --   --  9.5  --   MG  --   --  2.0  --   --    Liver Function Tests: Recent Labs  Lab 03/08/21 1050  AST 31  ALT 21  ALKPHOS 48  BILITOT 0.7  PROT 7.1  ALBUMIN 4.4   No results for input(s): LIPASE, AMYLASE in the last 168 hours. No results for input(s): AMMONIA in the last 168 hours. CBC: Recent Labs  Lab 03/08/21 1050 03/08/21 1106  WBC 9.1  --   NEUTROABS 6.4  --   HGB 14.4 14.6  HCT 43.3 43.0  MCV 93.9  --   PLT 250  --    Cardiac Enzymes: No results for input(s): CKTOTAL, CKMB, CKMBINDEX, TROPONINI in the last 168 hours. BNP: BNP (last 3 results) No results for input(s): BNP in the last 8760 hours.  ProBNP (last 3 results) No results for input(s): PROBNP in the last 8760 hours.  CBG: Recent Labs  Lab 03/08/21 1046  GLUCAP 119*  Signed:  Irine Seal MD.  [facility] 03/10/2021, 10:23 AM

## 2021-03-10 NOTE — Progress Notes (Signed)
Patient has ordered for discharge. Given discharge instructions to the patient . Iv removed. Given all belongings to the patient. 

## 2021-03-11 LAB — HEMOGLOBIN A1C
Hgb A1c MFr Bld: 6 % — ABNORMAL HIGH (ref 4.8–5.6)
Mean Plasma Glucose: 126 mg/dL

## 2021-03-15 DIAGNOSIS — I10 Essential (primary) hypertension: Secondary | ICD-10-CM | POA: Diagnosis not present

## 2021-03-15 DIAGNOSIS — E7800 Pure hypercholesterolemia, unspecified: Secondary | ICD-10-CM | POA: Diagnosis not present

## 2021-03-15 DIAGNOSIS — G459 Transient cerebral ischemic attack, unspecified: Secondary | ICD-10-CM | POA: Diagnosis not present

## 2021-03-15 DIAGNOSIS — Z299 Encounter for prophylactic measures, unspecified: Secondary | ICD-10-CM | POA: Diagnosis not present

## 2021-03-29 DIAGNOSIS — Z0000 Encounter for general adult medical examination without abnormal findings: Secondary | ICD-10-CM | POA: Diagnosis not present

## 2021-03-29 DIAGNOSIS — Z7189 Other specified counseling: Secondary | ICD-10-CM | POA: Diagnosis not present

## 2021-03-29 DIAGNOSIS — Z6825 Body mass index (BMI) 25.0-25.9, adult: Secondary | ICD-10-CM | POA: Diagnosis not present

## 2021-03-29 DIAGNOSIS — R030 Elevated blood-pressure reading, without diagnosis of hypertension: Secondary | ICD-10-CM | POA: Diagnosis not present

## 2021-03-29 DIAGNOSIS — Z1339 Encounter for screening examination for other mental health and behavioral disorders: Secondary | ICD-10-CM | POA: Diagnosis not present

## 2021-03-29 DIAGNOSIS — Z299 Encounter for prophylactic measures, unspecified: Secondary | ICD-10-CM | POA: Diagnosis not present

## 2021-03-29 DIAGNOSIS — E1165 Type 2 diabetes mellitus with hyperglycemia: Secondary | ICD-10-CM | POA: Diagnosis not present

## 2021-03-29 DIAGNOSIS — Z1331 Encounter for screening for depression: Secondary | ICD-10-CM | POA: Diagnosis not present

## 2021-04-16 DIAGNOSIS — Z23 Encounter for immunization: Secondary | ICD-10-CM | POA: Diagnosis not present

## 2021-06-12 ENCOUNTER — Encounter: Payer: Self-pay | Admitting: Diagnostic Neuroimaging

## 2021-06-12 ENCOUNTER — Ambulatory Visit (INDEPENDENT_AMBULATORY_CARE_PROVIDER_SITE_OTHER): Payer: Medicare Other | Admitting: Diagnostic Neuroimaging

## 2021-06-12 VITALS — BP 128/82 | HR 64 | Ht 72.0 in | Wt 192.0 lb

## 2021-06-12 DIAGNOSIS — G453 Amaurosis fugax: Secondary | ICD-10-CM

## 2021-06-12 NOTE — Progress Notes (Signed)
[facility]  PATIENT: Douglas Erickson DOB: July 11, 1946  REFERRING CLINICIAN: Rayna Sexton, PA-C HISTORY FROM: patient  REASON FOR VISIT: new consult    HISTORICAL  CHIEF COMPLAINT:  Chief Complaint  Patient presents with   New Patient (Initial Visit)    RM 7 wife here for consult from ED visit on 03/08/21. Pt reports he went to the ed for visual changes- reports he has been doing well since this visit. Denies any visual issues today.    HISTORY OF PRESENT ILLNESS:   75 year old male with transient left eye vision loss.  03/08/21 patient was at home doing household chores and exerting himself when all of a sudden he felt left eye vision loss.  Symptoms lasted 1 minute and resolved.  He had several more events, typically when he was bending over.  Patient went to eye doctor who recommended ER evaluation.  TIA stroke work-up was completed.  No acute findings were found.  Patient was discharged on aspirin and Plavix for 3 weeks, then switched to Plavix alone.  Also statin was changed to Crestor.  No further events.  Patient doing well.   REVIEW OF SYSTEMS: Full 14 system review of systems performed and negative with exception of: as per HPI.  ALLERGIES: No Known Allergies  HOME MEDICATIONS: Outpatient Medications Prior to Visit  Medication Sig Dispense Refill   acetaminophen (TYLENOL) 325 MG tablet Take 2 tablets (650 mg total) by mouth every 4 (four) hours as needed for mild pain (or temp > 37.5 C (99.5 F)).     albuterol (VENTOLIN HFA) 108 (90 Base) MCG/ACT inhaler Inhale 2 puffs into the lungs every 6 (six) hours as needed for shortness of breath.     Apple Cid Vn-Grn Tea-Bit Or-Cr (APPLE CIDER VINEGAR PLUS PO) Take 10 mLs by mouth daily as needed (Indigestion). Mix with water     Ascorbic Acid (VITAMIN C PO) Take 200 mg by mouth daily.     calcipotriene (DOVONOX) 0.005 % cream Apply 1 application topically daily.     cetirizine (ZYRTEC) 10 MG tablet Take  10 mg by mouth daily.     clopidogrel (PLAVIX) 75 MG tablet Take 1 tablet (75 mg total) by mouth daily. 30 tablet 1   Coenzyme Q10 (CO Q 10) 100 MG CAPS Take 100 mg by mouth at bedtime.     Cyanocobalamin (VITAMIN B 12) 100 MCG LOZG Take 1,000 mg by mouth daily.     FLUoxetine (PROZAC) 40 MG capsule Take 40 mg by mouth daily.     Garlic 123XX123 MG CAPS Take 2,000 mg by mouth daily.     ipratropium (ATROVENT) 0.03 % nasal spray Place 1 spray into both nostrils daily as needed for congestion.     MAGNESIUM CITRATE PO Take 100 mg by mouth in the morning and at bedtime.     Milk Thistle 1000 MG CAPS Take 1,000 mg by mouth daily.     Multiple Vitamin (MULTIVITAMIN) capsule Take 1 capsule by mouth at bedtime.     Omega-3 Fatty Acids (FISH OIL PO) Take 2,400 mg by mouth in the morning and at bedtime.     Potassium 99 MG TABS Take 99 mg by mouth at bedtime.     rosuvastatin (CRESTOR) 5 MG tablet Take 1 tablet (5 mg total) by mouth daily. 30 tablet 1   Skin Protectants, Misc. (EUCERIN) cream Apply 1 application topically at bedtime.     No facility-administered medications prior to visit.  PAST MEDICAL HISTORY: Past Medical History:  Diagnosis Date   Anxiety    Asthma    Depression    Diabetes mellitus without complication (HCC)    High cholesterol    Hypertension    Kidney disease    Pneumonia     PAST SURGICAL HISTORY: Past Surgical History:  Procedure Laterality Date   COLONOSCOPY WITH PROPOFOL N/A 01/09/2021   Procedure: COLONOSCOPY WITH PROPOFOL;  Surgeon: Rogene Houston, MD;  Location: [facility];  Service: Endoscopy;  Laterality: N/A;  Am   NASAL RECONSTRUCTION     POLYPECTOMY  01/09/2021   Procedure: POLYPECTOMY;  Surgeon: Rogene Houston, MD;  Location: [facility];  Service: Endoscopy;;   TONSILLECTOMY     VASECTOMY     VASECTOMY REVERSAL      FAMILY HISTORY: Family History  Problem Relation Age of Onset   Healthy Mother    COPD Father     SOCIAL  HISTORY: Social History   Socioeconomic History   Marital status: Married    Spouse name: Not on file   Number of children: Not on file   Years of education: Not on file   Highest education level: Not on file  Occupational History   Not on file  Tobacco Use   Smoking status: Never   Smokeless tobacco: Never  Vaping Use   Vaping Use: Never used  Substance and Sexual Activity   Alcohol use: Not Currently   Drug use: Never   Sexual activity: Not on file  Other Topics Concern   Not on file  Social History Narrative   Lives at home with wife    Right handed    Social Determinants of Health   Financial Resource Strain: Not on file  Food Insecurity: Not on file  Transportation Needs: Not on file  Physical Activity: Not on file  Stress: Not on file  Social Connections: Not on file  Intimate Partner Violence: Not on file     PHYSICAL EXAM  GENERAL EXAM/CONSTITUTIONAL: Vitals:  Vitals:   06/12/21 1059  BP: 128/82  Pulse: 64  SpO2: 97%  Weight: 192 lb (87.1 kg)  Height: 6' (1.829 m)   Body mass index is 26.04 kg/m. Wt Readings from Last 3 Encounters:  06/12/21 192 lb (87.1 kg)  03/08/21 179 lb 14.3 oz (81.6 kg)  01/07/21 180 lb (81.6 kg)   Patient is in no distress; well developed, nourished and groomed; neck is supple  CARDIOVASCULAR: Examination of carotid arteries is normal; no carotid bruits Regular rate and rhythm, no murmurs Examination of peripheral vascular system by observation and palpation is normal  EYES: Ophthalmoscopic exam of optic discs and posterior segments is normal; no papilledema or hemorrhages No results found.  MUSCULOSKELETAL: Gait, strength, tone, movements noted in Neurologic exam below  NEUROLOGIC: MENTAL STATUS:  No flowsheet data found. awake, alert, oriented to person, place and time recent and remote memory intact normal attention and concentration language fluent, comprehension intact, naming intact fund of knowledge  appropriate  CRANIAL NERVE:  2nd - no papilledema on fundoscopic exam 2nd, 3rd, 4th, 6th - pupils equal and reactive to light, visual fields full to confrontation, extraocular muscles intact, no nystagmus 5th - facial sensation symmetric 7th - facial strength symmetric 8th - hearing intact 9th - palate elevates symmetrically, uvula midline 11th - shoulder shrug symmetric 12th - tongue protrusion midline  MOTOR:  normal bulk and tone, full strength in the BUE, BLE  SENSORY:  normal and symmetric  to light touch, temperature, vibration  COORDINATION:  finger-nose-finger, fine finger movements normal  REFLEXES:  deep tendon reflexes present and symmetric  GAIT/STATION:  narrow based gait     DIAGNOSTIC DATA (LABS, IMAGING, TESTING) - I reviewed patient records, labs, notes, testing and imaging myself where available.  Lab Results  Component Value Date   WBC 9.1 03/08/2021   HGB 14.6 03/08/2021   HCT 43.0 03/08/2021   MCV 93.9 03/08/2021   PLT 250 03/08/2021      Component Value Date/Time   NA 138 03/10/2021 0313   K 3.7 03/10/2021 0826   CL 104 03/10/2021 0313   CO2 25 03/10/2021 0313   GLUCOSE 105 (H) 03/10/2021 0313   BUN 20 03/10/2021 0313   CREATININE 1.11 03/10/2021 0313   CALCIUM 9.5 03/10/2021 0313   PROT 7.1 03/08/2021 1050   ALBUMIN 4.4 03/08/2021 1050   AST 31 03/08/2021 1050   ALT 21 03/08/2021 1050   ALKPHOS 48 03/08/2021 1050   BILITOT 0.7 03/08/2021 1050   GFRNONAA >60 03/10/2021 0313   Lab Results  Component Value Date   CHOL 209 (H) 03/09/2021   HDL 50 03/09/2021   LDLCALC 132 (H) 03/09/2021   TRIG 135 03/09/2021   CHOLHDL 4.2 03/09/2021   Lab Results  Component Value Date   HGBA1C 6.0 (H) 03/09/2021   No results found for: VITAMINB12 No results found for: TSH   03/09/21 TTE 1. Left ventricular ejection fraction, by estimation, is 60 to 65%. The  left ventricle has normal function. The left ventricle has no regional  wall motion  abnormalities. Left ventricular diastolic parameters are  consistent with Grade I diastolic  dysfunction (impaired relaxation).   2. Right ventricular systolic function is normal. The right ventricular  size is normal. Tricuspid regurgitation signal is inadequate for assessing  PA pressure.   3. The mitral valve is normal in structure. Trivial mitral valve  regurgitation.   4. The aortic valve is tricuspid. There is mild calcification of the  aortic valve. There is mild thickening of the aortic valve. Aortic valve  regurgitation is not visualized. Mild aortic valve sclerosis is present,  with no evidence of aortic valve  stenosis.   03/08/21 MRI brain - No acute infarction, hemorrhage, or mass. Mild chronic microvascular ischemic  changes. - No proximal intracranial vessel occlusion or significant stenosis.  - No large vessel occlusion or hemodynamically significant stenosis in the neck.    ASSESSMENT AND PLAN  75 y.o. year old male here with:   Dx:  1. Amaurosis fugax of left eye      PLAN:  LEFT EYE AMAUROSIS FUGAX (TIA) - continue plavix '75mg'$ , crestor - monitor BP, sugars - optimize nutrition, exercise  Return for return to PCP, pending if symptoms worsen or fail to improve.    Penni Bombard, MD [id number], [id number] PM Certified in Neurology, Neurophysiology and Neuroimaging  [facility] [address] [place], [place] [address] [phone]

## 2021-06-18 DIAGNOSIS — Z20828 Contact with and (suspected) exposure to other viral communicable diseases: Secondary | ICD-10-CM | POA: Diagnosis not present

## 2021-06-18 DIAGNOSIS — Z23 Encounter for immunization: Secondary | ICD-10-CM | POA: Diagnosis not present

## 2021-06-27 DIAGNOSIS — Z23 Encounter for immunization: Secondary | ICD-10-CM | POA: Diagnosis not present

## 2021-06-27 DIAGNOSIS — Z20828 Contact with and (suspected) exposure to other viral communicable diseases: Secondary | ICD-10-CM | POA: Diagnosis not present

## 2021-07-03 DIAGNOSIS — Z79899 Other long term (current) drug therapy: Secondary | ICD-10-CM | POA: Diagnosis not present

## 2021-07-03 DIAGNOSIS — Z125 Encounter for screening for malignant neoplasm of prostate: Secondary | ICD-10-CM | POA: Diagnosis not present

## 2021-07-03 DIAGNOSIS — G459 Transient cerebral ischemic attack, unspecified: Secondary | ICD-10-CM | POA: Diagnosis not present

## 2021-07-05 DIAGNOSIS — R509 Fever, unspecified: Secondary | ICD-10-CM | POA: Diagnosis not present

## 2021-07-05 DIAGNOSIS — E1165 Type 2 diabetes mellitus with hyperglycemia: Secondary | ICD-10-CM | POA: Diagnosis not present

## 2021-07-05 DIAGNOSIS — Z299 Encounter for prophylactic measures, unspecified: Secondary | ICD-10-CM | POA: Diagnosis not present

## 2021-07-05 DIAGNOSIS — I10 Essential (primary) hypertension: Secondary | ICD-10-CM | POA: Diagnosis not present

## 2021-07-05 DIAGNOSIS — Z6825 Body mass index (BMI) 25.0-25.9, adult: Secondary | ICD-10-CM | POA: Diagnosis not present

## 2021-08-28 DIAGNOSIS — Z20828 Contact with and (suspected) exposure to other viral communicable diseases: Secondary | ICD-10-CM | POA: Diagnosis not present

## 2021-10-11 DIAGNOSIS — I10 Essential (primary) hypertension: Secondary | ICD-10-CM | POA: Diagnosis not present

## 2021-10-11 DIAGNOSIS — Z299 Encounter for prophylactic measures, unspecified: Secondary | ICD-10-CM | POA: Diagnosis not present

## 2021-10-11 DIAGNOSIS — I700 Atherosclerosis of aorta: Secondary | ICD-10-CM | POA: Diagnosis not present

## 2021-10-11 DIAGNOSIS — Z6825 Body mass index (BMI) 25.0-25.9, adult: Secondary | ICD-10-CM | POA: Diagnosis not present

## 2021-10-11 DIAGNOSIS — E1165 Type 2 diabetes mellitus with hyperglycemia: Secondary | ICD-10-CM | POA: Diagnosis not present

## 2021-10-11 DIAGNOSIS — Z87891 Personal history of nicotine dependence: Secondary | ICD-10-CM | POA: Diagnosis not present

## 2022-03-13 DIAGNOSIS — H43813 Vitreous degeneration, bilateral: Secondary | ICD-10-CM | POA: Diagnosis not present

## 2022-04-03 DIAGNOSIS — Z299 Encounter for prophylactic measures, unspecified: Secondary | ICD-10-CM | POA: Diagnosis not present

## 2022-04-03 DIAGNOSIS — Z1339 Encounter for screening examination for other mental health and behavioral disorders: Secondary | ICD-10-CM | POA: Diagnosis not present

## 2022-04-03 DIAGNOSIS — Z6825 Body mass index (BMI) 25.0-25.9, adult: Secondary | ICD-10-CM | POA: Diagnosis not present

## 2022-04-03 DIAGNOSIS — Z7189 Other specified counseling: Secondary | ICD-10-CM | POA: Diagnosis not present

## 2022-04-03 DIAGNOSIS — Z1331 Encounter for screening for depression: Secondary | ICD-10-CM | POA: Diagnosis not present

## 2022-04-03 DIAGNOSIS — Z0000 Encounter for general adult medical examination without abnormal findings: Secondary | ICD-10-CM | POA: Diagnosis not present

## 2022-04-03 DIAGNOSIS — Z125 Encounter for screening for malignant neoplasm of prostate: Secondary | ICD-10-CM | POA: Diagnosis not present

## 2022-04-03 DIAGNOSIS — Z79899 Other long term (current) drug therapy: Secondary | ICD-10-CM | POA: Diagnosis not present

## 2022-04-03 DIAGNOSIS — I10 Essential (primary) hypertension: Secondary | ICD-10-CM | POA: Diagnosis not present

## 2022-04-03 DIAGNOSIS — R5383 Other fatigue: Secondary | ICD-10-CM | POA: Diagnosis not present

## 2022-04-03 DIAGNOSIS — E7800 Pure hypercholesterolemia, unspecified: Secondary | ICD-10-CM | POA: Diagnosis not present

## 2022-04-03 DIAGNOSIS — E1165 Type 2 diabetes mellitus with hyperglycemia: Secondary | ICD-10-CM | POA: Diagnosis not present

## 2022-05-22 DIAGNOSIS — L821 Other seborrheic keratosis: Secondary | ICD-10-CM | POA: Diagnosis not present

## 2022-05-22 DIAGNOSIS — D2222 Melanocytic nevi of left ear and external auricular canal: Secondary | ICD-10-CM | POA: Diagnosis not present

## 2022-05-22 DIAGNOSIS — L570 Actinic keratosis: Secondary | ICD-10-CM | POA: Diagnosis not present

## 2022-05-22 DIAGNOSIS — L218 Other seborrheic dermatitis: Secondary | ICD-10-CM | POA: Diagnosis not present

## 2022-05-22 DIAGNOSIS — D225 Melanocytic nevi of trunk: Secondary | ICD-10-CM | POA: Diagnosis not present

## 2022-05-22 DIAGNOSIS — L814 Other melanin hyperpigmentation: Secondary | ICD-10-CM | POA: Diagnosis not present

## 2022-06-05 DIAGNOSIS — Z23 Encounter for immunization: Secondary | ICD-10-CM | POA: Diagnosis not present

## 2022-06-30 DIAGNOSIS — Z23 Encounter for immunization: Secondary | ICD-10-CM | POA: Diagnosis not present

## 2022-10-08 DIAGNOSIS — E1165 Type 2 diabetes mellitus with hyperglycemia: Secondary | ICD-10-CM | POA: Diagnosis not present

## 2022-10-08 DIAGNOSIS — Z6824 Body mass index (BMI) 24.0-24.9, adult: Secondary | ICD-10-CM | POA: Diagnosis not present

## 2022-10-08 DIAGNOSIS — Z299 Encounter for prophylactic measures, unspecified: Secondary | ICD-10-CM | POA: Diagnosis not present

## 2022-10-08 DIAGNOSIS — I10 Essential (primary) hypertension: Secondary | ICD-10-CM | POA: Diagnosis not present

## 2022-10-08 DIAGNOSIS — L409 Psoriasis, unspecified: Secondary | ICD-10-CM | POA: Diagnosis not present

## 2022-10-08 DIAGNOSIS — I700 Atherosclerosis of aorta: Secondary | ICD-10-CM | POA: Diagnosis not present

## 2022-10-30 DIAGNOSIS — D225 Melanocytic nevi of trunk: Secondary | ICD-10-CM | POA: Diagnosis not present

## 2022-12-25 DIAGNOSIS — Z23 Encounter for immunization: Secondary | ICD-10-CM | POA: Diagnosis not present

## 2023-01-12 DIAGNOSIS — I10 Essential (primary) hypertension: Secondary | ICD-10-CM | POA: Diagnosis not present

## 2023-01-12 DIAGNOSIS — E1165 Type 2 diabetes mellitus with hyperglycemia: Secondary | ICD-10-CM | POA: Diagnosis not present

## 2023-01-12 DIAGNOSIS — Z299 Encounter for prophylactic measures, unspecified: Secondary | ICD-10-CM | POA: Diagnosis not present

## 2023-03-16 DIAGNOSIS — H43813 Vitreous degeneration, bilateral: Secondary | ICD-10-CM | POA: Diagnosis not present

## 2023-04-08 DIAGNOSIS — E7800 Pure hypercholesterolemia, unspecified: Secondary | ICD-10-CM | POA: Diagnosis not present

## 2023-04-08 DIAGNOSIS — Z125 Encounter for screening for malignant neoplasm of prostate: Secondary | ICD-10-CM | POA: Diagnosis not present

## 2023-04-08 DIAGNOSIS — Z299 Encounter for prophylactic measures, unspecified: Secondary | ICD-10-CM | POA: Diagnosis not present

## 2023-04-08 DIAGNOSIS — Z7189 Other specified counseling: Secondary | ICD-10-CM | POA: Diagnosis not present

## 2023-04-08 DIAGNOSIS — Z79899 Other long term (current) drug therapy: Secondary | ICD-10-CM | POA: Diagnosis not present

## 2023-04-08 DIAGNOSIS — R5383 Other fatigue: Secondary | ICD-10-CM | POA: Diagnosis not present

## 2023-04-08 DIAGNOSIS — Z1339 Encounter for screening examination for other mental health and behavioral disorders: Secondary | ICD-10-CM | POA: Diagnosis not present

## 2023-04-08 DIAGNOSIS — Z1331 Encounter for screening for depression: Secondary | ICD-10-CM | POA: Diagnosis not present

## 2023-04-08 DIAGNOSIS — Z0000 Encounter for general adult medical examination without abnormal findings: Secondary | ICD-10-CM | POA: Diagnosis not present

## 2023-06-09 DIAGNOSIS — Z23 Encounter for immunization: Secondary | ICD-10-CM | POA: Diagnosis not present

## 2023-06-11 DIAGNOSIS — Z23 Encounter for immunization: Secondary | ICD-10-CM | POA: Diagnosis not present

## 2023-08-12 DIAGNOSIS — I700 Atherosclerosis of aorta: Secondary | ICD-10-CM | POA: Diagnosis not present

## 2023-08-12 DIAGNOSIS — E1169 Type 2 diabetes mellitus with other specified complication: Secondary | ICD-10-CM | POA: Diagnosis not present

## 2023-08-12 DIAGNOSIS — I10 Essential (primary) hypertension: Secondary | ICD-10-CM | POA: Diagnosis not present

## 2023-08-12 DIAGNOSIS — Z299 Encounter for prophylactic measures, unspecified: Secondary | ICD-10-CM | POA: Diagnosis not present

## 2023-11-23 DIAGNOSIS — I700 Atherosclerosis of aorta: Secondary | ICD-10-CM | POA: Diagnosis not present

## 2023-11-23 DIAGNOSIS — E1169 Type 2 diabetes mellitus with other specified complication: Secondary | ICD-10-CM | POA: Diagnosis not present

## 2023-11-23 DIAGNOSIS — Z299 Encounter for prophylactic measures, unspecified: Secondary | ICD-10-CM | POA: Diagnosis not present

## 2023-11-23 DIAGNOSIS — I10 Essential (primary) hypertension: Secondary | ICD-10-CM | POA: Diagnosis not present

## 2024-01-05 DIAGNOSIS — J069 Acute upper respiratory infection, unspecified: Secondary | ICD-10-CM | POA: Diagnosis not present

## 2024-01-05 DIAGNOSIS — R6889 Other general symptoms and signs: Secondary | ICD-10-CM | POA: Diagnosis not present

## 2024-01-05 DIAGNOSIS — E1169 Type 2 diabetes mellitus with other specified complication: Secondary | ICD-10-CM | POA: Diagnosis not present

## 2024-01-05 DIAGNOSIS — Z299 Encounter for prophylactic measures, unspecified: Secondary | ICD-10-CM | POA: Diagnosis not present

## 2024-03-14 DIAGNOSIS — H43811 Vitreous degeneration, right eye: Secondary | ICD-10-CM | POA: Diagnosis not present

## 2024-04-12 DIAGNOSIS — E7800 Pure hypercholesterolemia, unspecified: Secondary | ICD-10-CM | POA: Diagnosis not present

## 2024-04-12 DIAGNOSIS — Z79899 Other long term (current) drug therapy: Secondary | ICD-10-CM | POA: Diagnosis not present

## 2024-04-12 DIAGNOSIS — I10 Essential (primary) hypertension: Secondary | ICD-10-CM | POA: Diagnosis not present

## 2024-04-12 DIAGNOSIS — Z299 Encounter for prophylactic measures, unspecified: Secondary | ICD-10-CM | POA: Diagnosis not present

## 2024-04-12 DIAGNOSIS — Z7189 Other specified counseling: Secondary | ICD-10-CM | POA: Diagnosis not present

## 2024-04-12 DIAGNOSIS — R5383 Other fatigue: Secondary | ICD-10-CM | POA: Diagnosis not present

## 2024-04-12 DIAGNOSIS — Z0000 Encounter for general adult medical examination without abnormal findings: Secondary | ICD-10-CM | POA: Diagnosis not present

## 2024-04-12 DIAGNOSIS — Z1339 Encounter for screening examination for other mental health and behavioral disorders: Secondary | ICD-10-CM | POA: Diagnosis not present

## 2024-04-12 DIAGNOSIS — Z1331 Encounter for screening for depression: Secondary | ICD-10-CM | POA: Diagnosis not present

## 2024-04-12 DIAGNOSIS — Z125 Encounter for screening for malignant neoplasm of prostate: Secondary | ICD-10-CM | POA: Diagnosis not present

## 2024-04-12 DIAGNOSIS — E1165 Type 2 diabetes mellitus with hyperglycemia: Secondary | ICD-10-CM | POA: Diagnosis not present

## 2024-06-10 DIAGNOSIS — Z23 Encounter for immunization: Secondary | ICD-10-CM | POA: Diagnosis not present

## 2024-06-15 DIAGNOSIS — Z23 Encounter for immunization: Secondary | ICD-10-CM | POA: Diagnosis not present

## 2024-07-19 DIAGNOSIS — E119 Type 2 diabetes mellitus without complications: Secondary | ICD-10-CM | POA: Diagnosis not present

## 2024-07-19 DIAGNOSIS — I10 Essential (primary) hypertension: Secondary | ICD-10-CM | POA: Diagnosis not present

## 2024-07-19 DIAGNOSIS — Z299 Encounter for prophylactic measures, unspecified: Secondary | ICD-10-CM | POA: Diagnosis not present

## 2024-07-19 DIAGNOSIS — R5383 Other fatigue: Secondary | ICD-10-CM | POA: Diagnosis not present
